# Patient Record
Sex: Male | Born: 1960 | Race: White | Hispanic: No | Marital: Married | Smoking: Current every day smoker
Health system: Southern US, Community
[De-identification: ages and names within clinical notes are randomized; demographics above are authoritative.]

## PROBLEM LIST (undated history)

## (undated) DIAGNOSIS — E7800 Pure hypercholesterolemia, unspecified: Secondary | ICD-10-CM

## (undated) DIAGNOSIS — N200 Calculus of kidney: Secondary | ICD-10-CM

## (undated) DIAGNOSIS — J40 Bronchitis, not specified as acute or chronic: Secondary | ICD-10-CM

## (undated) DIAGNOSIS — I10 Essential (primary) hypertension: Secondary | ICD-10-CM

## (undated) HISTORY — DX: Morbid (severe) obesity due to excess calories: E66.01

## (undated) HISTORY — PX: OTHER SURGICAL HISTORY: SHX169

## (undated) SURGERY — Surgical Case
Anesthesia: *Unknown

---

## 1998-04-01 HISTORY — PX: COLONOSCOPY W/ POLYPECTOMY: SHX1380

## 2009-04-04 ENCOUNTER — Emergency Department (HOSPITAL_COMMUNITY): Admission: EM | Admit: 2009-04-04 | Discharge: 2009-04-04 | Payer: Self-pay | Admitting: Emergency Medicine

## 2009-08-10 ENCOUNTER — Emergency Department (HOSPITAL_COMMUNITY): Admission: EM | Admit: 2009-08-10 | Discharge: 2009-08-10 | Payer: Self-pay | Admitting: Family Medicine

## 2010-06-17 LAB — BASIC METABOLIC PANEL
BUN: 9 mg/dL (ref 6–23)
CO2: 29 mEq/L (ref 19–32)
Calcium: 9.2 mg/dL (ref 8.4–10.5)
Chloride: 106 mEq/L (ref 96–112)
Creatinine, Ser: 0.86 mg/dL (ref 0.4–1.5)
GFR calc Af Amer: >60 mL/min (ref >60)
GFR calc non Af Amer: >60 mL/min (ref >60)
Glucose, Bld: 123 mg/dL — ABNORMAL HIGH (ref 70–99)
Potassium: 4.1 mEq/L (ref 3.5–5.1)
Sodium: 139 mEq/L (ref 135–145)

## 2011-06-28 ENCOUNTER — Encounter (HOSPITAL_COMMUNITY): Payer: Self-pay | Admitting: *Deleted

## 2011-06-28 ENCOUNTER — Emergency Department (HOSPITAL_COMMUNITY)
Admission: EM | Admit: 2011-06-28 | Discharge: 2011-06-28 | Disposition: A | Discharge status: Home / Self Care | Payer: PRIVATE HEALTH INSURANCE | Attending: Emergency Medicine | Admitting: Emergency Medicine

## 2011-06-28 DIAGNOSIS — J209 Acute bronchitis, unspecified: Secondary | ICD-10-CM | POA: Insufficient documentation

## 2011-06-28 DIAGNOSIS — R059 Cough, unspecified: Secondary | ICD-10-CM | POA: Insufficient documentation

## 2011-06-28 DIAGNOSIS — I10 Essential (primary) hypertension: Secondary | ICD-10-CM | POA: Insufficient documentation

## 2011-06-28 DIAGNOSIS — R062 Wheezing: Secondary | ICD-10-CM | POA: Insufficient documentation

## 2011-06-28 DIAGNOSIS — R05 Cough: Secondary | ICD-10-CM | POA: Insufficient documentation

## 2011-06-28 HISTORY — DX: Pure hypercholesterolemia, unspecified: E78.00

## 2011-06-28 HISTORY — DX: Essential (primary) hypertension: I10

## 2011-06-28 HISTORY — DX: Bronchitis, not specified as acute or chronic: J40

## 2011-06-28 LAB — GLUCOSE, CAPILLARY: Glucose-Capillary: 145 mg/dL — ABNORMAL HIGH (ref 70–99)

## 2011-06-28 MED ORDER — BENZONATATE 200 MG PO CAPS: 200.0000 mg | ORAL_CAPSULE | Freq: Three times a day (TID) | ORAL | Status: AC

## 2011-06-28 MED ORDER — PREDNISONE 10 MG PO TABS: ORAL_TABLET | ORAL | Status: DC

## 2011-06-28 MED ORDER — ALBUTEROL SULFATE HFA 108 (90 BASE) MCG/ACT IN AERS
2.0000 | INHALATION_SPRAY | Freq: Once | RESPIRATORY_TRACT | Status: AC
  Administered 2011-06-28: 2 via RESPIRATORY_TRACT
  Filled 2011-06-28: qty 6.7

## 2011-06-28 MED ORDER — AZITHROMYCIN 250 MG PO TABS: 250.0000 mg | ORAL_TABLET | Freq: Every day | ORAL | Status: AC

## 2011-06-28 NOTE — Discharge Instructions (Signed)

## 2011-06-28 NOTE — ED Provider Notes (Signed)
History     CSN: [id number]  Arrival date & time 06/28/11  1031   First MD Initiated Contact with Patient 06/28/11 1045      Chief Complaint  Patient presents with  . Sore Throat  . Cough    (Consider location/radiation/quality/duration/timing/severity/associated sxs/prior treatment) Patient is a 51 y.o. male presenting with cough. The history is provided by the patient.  Cough This is a new problem. The current episode started 2 days ago. The problem occurs constantly. The problem has not changed since onset.The cough is non-productive. There has been no fever. Associated symptoms include sore throat and wheezing. Pertinent negatives include no chest pain, no chills, no ear congestion, no ear pain, no headaches, no rhinorrhea and no shortness of breath. He has tried mist for the symptoms. The treatment provided mild relief. He is a smoker. His past medical history is significant for bronchitis. His past medical history does not include pneumonia, COPD or asthma.    Past Medical History  Diagnosis Date  . Bronchitis   . Hypertension   . Diabetes mellitus   . Hypercholesteremia     Past Surgical History  Procedure Date  . Kidney stone removal     No family history on file.  History  Substance Use Topics  . Smoking status: Current Some Day Smoker    Types: Cigars  . Smokeless tobacco: Not on file  . Alcohol Use: Yes     Occ wine      Review of Systems  Constitutional: Negative for fever and chills.  HENT: Positive for sore throat. Negative for ear pain, congestion, rhinorrhea, trouble swallowing, neck pain, postnasal drip and sinus pressure.   Eyes: Negative.   Respiratory: Positive for cough and wheezing. Negative for chest tightness and shortness of breath.   Cardiovascular: Negative for chest pain.  Gastrointestinal: Negative for nausea and abdominal pain.  Genitourinary: Negative.   Musculoskeletal: Negative for joint swelling and arthralgias.  Skin:  Negative.  Negative for rash and wound.  Neurological: Negative for dizziness, weakness, light-headedness, numbness and headaches.  Hematological: Negative.   Psychiatric/Behavioral: Negative.     Allergies  Penicillins  Home Medications   Current Outpatient Rx  Name Route Sig Dispense Refill  . AZITHROMYCIN 250 MG PO TABS Oral Take 1 tablet (250 mg total) by mouth daily. Take first 2 tablets together, then 1 every day until finished. 6 tablet 0  . BENZONATATE 200 MG PO CAPS Oral Take 1 capsule (200 mg total) by mouth every 8 (eight) hours. 21 capsule 0  . PREDNISONE 10 MG PO TABS  5, 4, 3, 2 then 1 tablet by mouth daily for 5 days total. 15 tablet 0    BP 126/79  Pulse 94  Temp(Src) 98.7 F (37.1 C) (Oral)  Resp 20  Ht 5\' 11"  (1.803 m)  Wt 300 lb (136.079 kg)  BMI 41.84 kg/m2  SpO2 96%  Physical Exam  Nursing note and vitals reviewed. Constitutional: He is oriented to person, place, and time. He appears well-developed and well-nourished.       Obese  HENT:  Head: Normocephalic and atraumatic.  Right Ear: Tympanic membrane normal.  Left Ear: Tympanic membrane normal.  Nose: Nose normal.  Mouth/Throat: Mucous membranes are normal. No oropharyngeal exudate, posterior oropharyngeal edema, posterior oropharyngeal erythema or tonsillar abscesses.  Eyes: Conjunctivae are normal.  Neck: Normal range of motion.  Cardiovascular: Normal rate, regular rhythm, normal heart sounds and intact distal pulses.   Pulmonary/Chest: Effort normal. No stridor. No  respiratory distress. He has wheezes. He has no rales. He exhibits no tenderness.       Occasional bilateral wheeze which clears with cough.  Lungs are otherwise clear.  Abdominal: Soft. Bowel sounds are normal.  Musculoskeletal: Normal range of motion.  Lymphadenopathy:    He has no cervical adenopathy.  Neurological: He is alert and oriented to person, place, and time.  Skin: Skin is warm and dry.  Psychiatric: He has a normal  mood and affect.    ED Course  Procedures (including critical care time)  Labs Reviewed  GLUCOSE, CAPILLARY - Abnormal; Notable for the following:    Glucose-Capillary 145 (*)    All other components within normal limits   No results found.   1. Bronchitis, acute, with bronchospasm       MDM  Exam and symptoms are consistent with bronchitis.  Lungs are clear with no evidence for pneumonia on exam.  Discussed chest x-ray, but patient defers.  Given albuterol MDI with spacer.  Also will prescribe a short course of prednisone, Tessalon for cough suppression and Zithromax .      Candis Musa, PA 06/28/11 1106  Candis Musa, PA 06/28/11 1109

## 2011-06-28 NOTE — ED Notes (Signed)
Pt states sore throat and non productive cough x 2 days. NAD.

## 2011-06-28 NOTE — ED Notes (Signed)
J Idol, PA in with pt 

## 2011-07-02 NOTE — ED Provider Notes (Signed)
Medical screening examination/treatment/procedure(s) were performed by non-physician practitioner and as supervising physician I was immediately available for consultation/collaboration.  Donnetta Hutching, MD 07/02/11 [phone]

## 2012-09-13 ENCOUNTER — Other Ambulatory Visit: Payer: Self-pay | Admitting: Family Medicine

## 2012-09-17 ENCOUNTER — Other Ambulatory Visit (INDEPENDENT_AMBULATORY_CARE_PROVIDER_SITE_OTHER): Payer: BC Managed Care – PPO

## 2012-09-17 DIAGNOSIS — I10 Essential (primary) hypertension: Secondary | ICD-10-CM

## 2012-09-17 DIAGNOSIS — E119 Type 2 diabetes mellitus without complications: Secondary | ICD-10-CM

## 2012-09-17 DIAGNOSIS — Z79899 Other long term (current) drug therapy: Secondary | ICD-10-CM

## 2012-09-17 DIAGNOSIS — E785 Hyperlipidemia, unspecified: Secondary | ICD-10-CM

## 2012-09-17 LAB — LIPID PANEL
Cholesterol: 112 mg/dL (ref 0–200)
HDL: 35 mg/dL — ABNORMAL LOW (ref >39)
LDL Cholesterol: 39 mg/dL (ref 0–99)
Total CHOL/HDL Ratio: 3.2 Ratio
Triglycerides: 190 mg/dL — ABNORMAL HIGH (ref <150)
VLDL: 38 mg/dL (ref 0–40)

## 2012-09-17 LAB — HEMOGLOBIN A1C
Hgb A1c MFr Bld: 6.3 % — ABNORMAL HIGH (ref <5.7)
Mean Plasma Glucose: 134 mg/dL — ABNORMAL HIGH (ref <117)

## 2012-09-17 LAB — CBC WITH DIFFERENTIAL/PLATELET
Basophils Absolute: 0 10*3/uL (ref 0.0–0.1)
Basophils Relative: 0 % (ref 0–1)
Eosinophils Absolute: 0.2 10*3/uL (ref 0.0–0.7)
Eosinophils Relative: 3 % (ref 0–5)
HCT: 45.6 % (ref 39.0–52.0)
Hemoglobin: 15.6 g/dL (ref 13.0–17.0)
Lymphocytes Relative: 25 % (ref 12–46)
Lymphs Abs: 1.5 10*3/uL (ref 0.7–4.0)
MCH: 32.1 pg (ref 26.0–34.0)
MCHC: 34.2 g/dL (ref 30.0–36.0)
MCV: 93.8 fL (ref 78.0–100.0)
Monocytes Absolute: 0.7 10*3/uL (ref 0.1–1.0)
Monocytes Relative: 11 % (ref 3–12)
Neutro Abs: 3.8 10*3/uL (ref 1.7–7.7)
Neutrophils Relative %: 61 % (ref 43–77)
Platelets: 247 10*3/uL (ref 150–400)
RBC: 4.86 MIL/uL (ref 4.22–5.81)
RDW: 14.4 % (ref 11.5–15.5)
WBC: 6.1 10*3/uL (ref 4.0–10.5)

## 2012-09-17 LAB — COMPREHENSIVE METABOLIC PANEL
ALT: 34 U/L (ref 0–53)
AST: 29 U/L (ref 0–37)
Albumin: 4.1 g/dL (ref 3.5–5.2)
Alkaline Phosphatase: 52 U/L (ref 39–117)
BUN: 15 mg/dL (ref 6–23)
CO2: 29 mEq/L (ref 19–32)
Calcium: 9.5 mg/dL (ref 8.4–10.5)
Chloride: 104 mEq/L (ref 96–112)
Creat: 1.09 mg/dL (ref 0.50–1.35)
Glucose, Bld: 138 mg/dL — ABNORMAL HIGH (ref 70–99)
Potassium: 4.3 mEq/L (ref 3.5–5.3)
Sodium: 143 mEq/L (ref 135–145)
Total Bilirubin: 0.5 mg/dL (ref 0.3–1.2)
Total Protein: 7.1 g/dL (ref 6.0–8.3)

## 2012-09-25 ENCOUNTER — Ambulatory Visit: Payer: Self-pay | Admitting: Family Medicine

## 2012-10-08 ENCOUNTER — Encounter: Payer: Self-pay | Admitting: Family Medicine

## 2012-10-09 MED ORDER — AMLODIPINE BESYLATE 10 MG PO TABS: 10.0000 mg | ORAL_TABLET | Freq: Every day | ORAL | Status: DC

## 2012-10-09 MED ORDER — SIMVASTATIN 40 MG PO TABS: 40.0000 mg | ORAL_TABLET | Freq: Every day | ORAL | Status: DC

## 2012-10-15 ENCOUNTER — Encounter: Payer: Self-pay | Admitting: Family Medicine

## 2012-10-16 ENCOUNTER — Other Ambulatory Visit: Payer: Self-pay | Admitting: Family Medicine

## 2012-10-16 MED ORDER — HYDROCHLOROTHIAZIDE 25 MG PO TABS: 25.0000 mg | ORAL_TABLET | Freq: Every day | ORAL | Status: DC

## 2012-10-16 NOTE — Telephone Encounter (Signed)
Rx Refilled  

## 2012-10-22 ENCOUNTER — Ambulatory Visit: Payer: BC Managed Care – PPO | Admitting: Family Medicine

## 2012-10-30 ENCOUNTER — Encounter: Payer: Self-pay | Admitting: Family Medicine

## 2012-10-30 ENCOUNTER — Ambulatory Visit (INDEPENDENT_AMBULATORY_CARE_PROVIDER_SITE_OTHER): Payer: BC Managed Care – PPO | Admitting: Family Medicine

## 2012-10-30 VITALS — BP 110/78 | HR 86 | Temp 98.8°F | Resp 20 | Wt 350.0 lb

## 2012-10-30 DIAGNOSIS — E7800 Pure hypercholesterolemia, unspecified: Secondary | ICD-10-CM

## 2012-10-30 DIAGNOSIS — E119 Type 2 diabetes mellitus without complications: Secondary | ICD-10-CM

## 2012-10-30 DIAGNOSIS — I10 Essential (primary) hypertension: Secondary | ICD-10-CM | POA: Insufficient documentation

## 2012-10-30 HISTORY — DX: Morbid (severe) obesity due to excess calories: E66.01

## 2012-10-30 NOTE — Progress Notes (Signed)
Subjective:    Patient ID: Eric Johnson, male    DOB: 06/22/1960, 52 y.o.   MRN: [id number]  HPI Patient is here today for followup of his diabetes mellitus type 2. Hemoglobin A1c is 6.3 indicating adequate control. He denies any polyuria, polydipsia, blurred vision. He denies any peripheral neuropathy. He also has hypertension. He is currently taking amlodipine 10 mg by mouth daily, hydrochlorothiazide 25 mg by mouth daily. He denies any chest pain or shortness of breath or dyspnea on exertion. He also has hyperlipidemia. He is currently taking Zocor 40 mg by mouth daily. He denies any myalgia or right upper quadrant pain. His most recent labwork as listed below: No visits with results within 1 Month(s) from this visit. Latest known visit with results is:  Lab on 09/17/2012  Component Date Value Range Status  . WBC 09/17/2012 6.1  4.0 - 10.5 K/uL Final  . RBC 09/17/2012 4.86  4.22 - 5.81 MIL/uL Final  . Hemoglobin 09/17/2012 15.6  13.0 - 17.0 g/dL Final  . HCT 40/98/1191 45.6  39.0 - 52.0 % Final  . MCV 09/17/2012 93.8  78.0 - 100.0 fL Final  . MCH 09/17/2012 32.1  26.0 - 34.0 pg Final  . MCHC 09/17/2012 34.2  30.0 - 36.0 g/dL Final  . RDW 47/82/9562 14.4  11.5 - 15.5 % Final  . Platelets 09/17/2012 247  150 - 400 K/uL Final  . Neutrophils Relative % 09/17/2012 61  43 - 77 % Final  . Neutro Abs 09/17/2012 3.8  1.7 - 7.7 K/uL Final  . Lymphocytes Relative 09/17/2012 25  12 - 46 % Final  . Lymphs Abs 09/17/2012 1.5  0.7 - 4.0 K/uL Final  . Monocytes Relative 09/17/2012 11  3 - 12 % Final  . Monocytes Absolute 09/17/2012 0.7  0.1 - 1.0 K/uL Final  . Eosinophils Relative 09/17/2012 3  0 - 5 % Final  . Eosinophils Absolute 09/17/2012 0.2  0.0 - 0.7 K/uL Final  . Basophils Relative 09/17/2012 0  0 - 1 % Final  . Basophils Absolute 09/17/2012 0.0  0.0 - 0.1 K/uL Final  . Smear Review 09/17/2012 Criteria for review not met   Final  . Sodium 09/17/2012 143  135 - 145 mEq/L Final  .  Potassium 09/17/2012 4.3  3.5 - 5.3 mEq/L Final  . Chloride 09/17/2012 104  96 - 112 mEq/L Final  . CO2 09/17/2012 29  19 - 32 mEq/L Final  . Glucose, Bld 09/17/2012 138* 70 - 99 mg/dL Final  . BUN 13/10/6576 15  6 - 23 mg/dL Final  . Creat 46/96/2952 1.09  0.50 - 1.35 mg/dL Final  . Total Bilirubin 09/17/2012 0.5  0.3 - 1.2 mg/dL Final  . Alkaline Phosphatase 09/17/2012 52  39 - 117 U/L Final  . AST 09/17/2012 29  0 - 37 U/L Final  . ALT 09/17/2012 34  0 - 53 U/L Final  . Total Protein 09/17/2012 7.1  6.0 - 8.3 g/dL Final  . Albumin 84/13/2440 4.1  3.5 - 5.2 g/dL Final  . Calcium 01/26/2535 9.5  8.4 - 10.5 mg/dL Final  . Hemoglobin U4Q 09/17/2012 6.3* <5.7 % Final   Comment:  According to the ADA Clinical Practice Recommendations for 2011, when                          HbA1c is used as a screening test:                                                       >=6.5%   Diagnostic of Diabetes Mellitus                                     (if abnormal result is confirmed)                                                     5.7-6.4%   Increased risk of developing Diabetes Mellitus                                                     References:Diagnosis and Classification of Diabetes Mellitus,Diabetes                          Care,2011,34(Suppl 1):S62-S69 and Standards of Medical Care in                                  Diabetes - 2011,Diabetes Care,2011,34 (Suppl 1):S11-S61.                             . Mean Plasma Glucose 09/17/2012 134* <117 mg/dL Final  . Cholesterol 21/30/8657 112  0 - 200 mg/dL Final   Comment: ATP III Classification:                                < 200        mg/dL        Desirable                               200 - 239     mg/dL        Borderline High                               >= 240        mg/dL        High                             . Triglycerides 09/17/2012  190* <150 mg/dL Final  . HDL 84/69/6295 35* >39 mg/dL Final  . Total CHOL/HDL Ratio 09/17/2012 3.2   Final  . VLDL 09/17/2012 38  0 - 40 mg/dL Final  .  LDL Cholesterol 09/17/2012 39  0 - 99 mg/dL Final   Comment:                            Total Cholesterol/HDL Ratio:CHD Risk                                                 Coronary Heart Disease Risk Table                                                                 Men       Women                                   1/2 Average Risk              3.4        3.3                                       Average Risk              5.0        4.4                                    2X Average Risk              9.6        7.1                                    3X Average Risk             23.4       11.0                          Use the calculated Patient Ratio above and the CHD Risk table                           to determine the patient's CHD Risk.                          ATP III Classification (LDL):                                < 100        mg/dL         Optimal                               100 - 129     mg/dL  Near or Above Optimal                               130 - 159     mg/dL         Borderline High                               160 - 189     mg/dL         High                                > 190        mg/dL         Very High                              Past Medical History  Diagnosis Date  . Bronchitis   . Diabetes mellitus   . Hypercholesteremia   . Hypertension   . Morbid obesity 10/30/2012   Current Outpatient Prescriptions on File Prior to Visit  Medication Sig Dispense Refill  . amLODipine (NORVASC) 10 MG tablet Take 1 tablet (10 mg total) by mouth daily.  90 tablet  0  . hydrochlorothiazide (HYDRODIURIL) 25 MG tablet Take 1 tablet (25 mg total) by mouth daily.  90 tablet  0  . metFORMIN (GLUCOPHAGE) 500 MG tablet TAKE ONE TABLET BY MOUTH TWICE DAILY  180 tablet  0  . simvastatin (ZOCOR) 40 MG tablet Take 1  tablet (40 mg total) by mouth at bedtime.  90 tablet  0   No current facility-administered medications on file prior to visit.   Allergies  Allergen Reactions  . Penicillins    History   Social History  . Marital Status: Married    Spouse Name: N/A    Number of Children: N/A  . Years of Education: N/A   Occupational History  . Not on file.   Social History Main Topics  . Smoking status: Current Some Day Smoker    Types: Cigars  . Smokeless tobacco: Not on file  . Alcohol Use: Yes     Comment: Occ wine  . Drug Use: No  . Sexually Active:    Other Topics Concern  . Not on file   Social History Narrative  . No narrative on file      Review of Systems  All other systems reviewed and are negative.       Objective:   Physical Exam  Constitutional: He is oriented to person, place, and time. He appears well-developed and well-nourished.  Neck: Normal range of motion. Neck supple. No thyromegaly present.  Cardiovascular: Normal rate, regular rhythm, normal heart sounds and intact distal pulses.  Exam reveals no gallop and no friction rub.   No murmur heard. Pulmonary/Chest: Effort normal and breath sounds normal. No respiratory distress. He has no wheezes. He has no rales. He exhibits no tenderness.  Abdominal: Soft. Bowel sounds are normal. He exhibits no distension and no mass. There is no tenderness. There is no rebound and no guarding.  Lymphadenopathy:    He has no cervical adenopathy.  Neurological: He is alert and oriented to person, place, and time. He has normal reflexes.  Skin: Skin is warm. No erythema.  Assessment & Plan:  1. Hypercholesteremia Cholesterols controlled except for low HDL and slightly elevated triglycerides. I recommended a low carbohydrate diet and increasing aerobic exercise to address his dyslipidemia.  2. Hypertension Blood pressures well controlled. Continue current medications at present dosages.  3. Type II or  unspecified type diabetes mellitus without mention of complication, not stated as uncontrolled Diabetes well controlled. Recommended annual eye exam, low carbohydrate diet, smoking cessation, and increased aerobic exercise  4. Morbid obesity See problem #3.

## 2012-12-17 ENCOUNTER — Other Ambulatory Visit: Payer: Self-pay | Admitting: Family Medicine

## 2012-12-17 MED ORDER — METFORMIN HCL 500 MG PO TABS: ORAL_TABLET | ORAL | Status: DC

## 2012-12-17 NOTE — Telephone Encounter (Signed)
Rx Refilled  

## 2012-12-24 ENCOUNTER — Encounter: Payer: Self-pay | Admitting: Family Medicine

## 2012-12-25 ENCOUNTER — Ambulatory Visit (INDEPENDENT_AMBULATORY_CARE_PROVIDER_SITE_OTHER): Payer: BC Managed Care – PPO | Admitting: Family Medicine

## 2012-12-25 DIAGNOSIS — Z23 Encounter for immunization: Secondary | ICD-10-CM

## 2013-01-06 ENCOUNTER — Other Ambulatory Visit: Payer: Self-pay | Admitting: Family Medicine

## 2013-01-07 MED ORDER — AMLODIPINE BESYLATE 10 MG PO TABS: 10.0000 mg | ORAL_TABLET | Freq: Every day | ORAL | Status: DC

## 2013-01-07 MED ORDER — SIMVASTATIN 40 MG PO TABS: 40.0000 mg | ORAL_TABLET | Freq: Every day | ORAL | Status: DC

## 2013-01-18 ENCOUNTER — Other Ambulatory Visit: Payer: Self-pay | Admitting: Family Medicine

## 2013-01-20 MED ORDER — HYDROCHLOROTHIAZIDE 25 MG PO TABS: 25.0000 mg | ORAL_TABLET | Freq: Every day | ORAL | Status: DC

## 2013-01-20 NOTE — Telephone Encounter (Signed)
Medication refilled per protocol. 

## 2013-02-12 ENCOUNTER — Other Ambulatory Visit: Payer: Self-pay | Admitting: Family Medicine

## 2013-02-12 MED ORDER — ZOSTER VACCINE LIVE 19400 UNT/0.65ML ~~LOC~~ SOLR: 0.6500 mL | Freq: Once | SUBCUTANEOUS | Status: DC

## 2013-02-12 NOTE — Telephone Encounter (Signed)
Zostavax sent to pharm 

## 2013-03-19 ENCOUNTER — Encounter: Payer: Self-pay | Admitting: Family Medicine

## 2013-03-19 MED ORDER — METFORMIN HCL 500 MG PO TABS: ORAL_TABLET | ORAL | Status: DC

## 2013-03-22 ENCOUNTER — Encounter: Payer: Self-pay | Admitting: Family Medicine

## 2013-03-23 ENCOUNTER — Other Ambulatory Visit: Payer: Self-pay | Admitting: Family Medicine

## 2013-03-23 MED ORDER — METFORMIN HCL 500 MG PO TABS: ORAL_TABLET | ORAL | Status: DC

## 2013-03-23 NOTE — Telephone Encounter (Signed)
Rx Refilled  

## 2013-04-07 ENCOUNTER — Encounter: Payer: Self-pay | Admitting: Family Medicine

## 2013-04-08 ENCOUNTER — Other Ambulatory Visit: Payer: Self-pay | Admitting: *Deleted

## 2013-04-08 MED ORDER — SIMVASTATIN 40 MG PO TABS: 40.0000 mg | ORAL_TABLET | Freq: Every day | ORAL | Status: DC

## 2013-04-08 MED ORDER — AMLODIPINE BESYLATE 10 MG PO TABS: 10.0000 mg | ORAL_TABLET | Freq: Every day | ORAL | Status: DC

## 2013-04-08 NOTE — Telephone Encounter (Signed)
Med refilled.

## 2013-07-15 ENCOUNTER — Encounter: Payer: Self-pay | Admitting: Family Medicine

## 2013-07-16 MED ORDER — AMLODIPINE BESYLATE 10 MG PO TABS: 10.0000 mg | ORAL_TABLET | Freq: Every day | ORAL | Status: DC

## 2013-07-16 MED ORDER — SIMVASTATIN 40 MG PO TABS: 40.0000 mg | ORAL_TABLET | Freq: Every day | ORAL | Status: DC

## 2013-07-16 MED ORDER — HYDROCHLOROTHIAZIDE 25 MG PO TABS: 25.0000 mg | ORAL_TABLET | Freq: Every day | ORAL | Status: DC

## 2013-07-28 ENCOUNTER — Other Ambulatory Visit: Payer: BC Managed Care – PPO

## 2013-07-28 DIAGNOSIS — Z79899 Other long term (current) drug therapy: Secondary | ICD-10-CM

## 2013-07-28 DIAGNOSIS — E785 Hyperlipidemia, unspecified: Secondary | ICD-10-CM

## 2013-07-28 DIAGNOSIS — E119 Type 2 diabetes mellitus without complications: Secondary | ICD-10-CM

## 2013-07-28 LAB — COMPLETE METABOLIC PANEL WITH GFR
ALT: 35 U/L (ref 0–53)
AST: 24 U/L (ref 0–37)
Albumin: 4.2 g/dL (ref 3.5–5.2)
Alkaline Phosphatase: 47 U/L (ref 39–117)
BUN: 22 mg/dL (ref 6–23)
CO2: 27 mEq/L (ref 19–32)
Calcium: 9.1 mg/dL (ref 8.4–10.5)
Chloride: 102 mEq/L (ref 96–112)
Creat: 1.09 mg/dL (ref 0.50–1.35)
GFR, Est African American: 88 mL/min
GFR, Est Non African American: 77 mL/min
Glucose, Bld: 131 mg/dL — ABNORMAL HIGH (ref 70–99)
Potassium: 4 mEq/L (ref 3.5–5.3)
Sodium: 137 mEq/L (ref 135–145)
Total Bilirubin: 0.5 mg/dL (ref 0.2–1.2)
Total Protein: 6.8 g/dL (ref 6.0–8.3)

## 2013-07-28 LAB — CBC WITH DIFFERENTIAL/PLATELET
Basophils Absolute: 0 K/uL (ref 0.0–0.1)
Basophils Relative: 0 % (ref 0–1)
Eosinophils Absolute: 0.1 K/uL (ref 0.0–0.7)
Eosinophils Relative: 2 % (ref 0–5)
HCT: 45.4 % (ref 39.0–52.0)
Hemoglobin: 15.6 g/dL (ref 13.0–17.0)
Lymphocytes Relative: 28 % (ref 12–46)
Lymphs Abs: 1.8 K/uL (ref 0.7–4.0)
MCH: 31.8 pg (ref 26.0–34.0)
MCHC: 34.4 g/dL (ref 30.0–36.0)
MCV: 92.7 fL (ref 78.0–100.0)
Monocytes Absolute: 0.8 K/uL (ref 0.1–1.0)
Monocytes Relative: 13 % — ABNORMAL HIGH (ref 3–12)
Neutro Abs: 3.6 K/uL (ref 1.7–7.7)
Neutrophils Relative %: 57 % (ref 43–77)
Platelets: 223 K/uL (ref 150–400)
RBC: 4.9 MIL/uL (ref 4.22–5.81)
RDW: 14 % (ref 11.5–15.5)
WBC: 6.4 K/uL (ref 4.0–10.5)

## 2013-07-28 LAB — LIPID PANEL
Cholesterol: 100 mg/dL (ref 0–200)
HDL: 33 mg/dL — ABNORMAL LOW (ref >39)
LDL Cholesterol: 29 mg/dL (ref 0–99)
Total CHOL/HDL Ratio: 3 ratio
Triglycerides: 192 mg/dL — ABNORMAL HIGH (ref <150)
VLDL: 38 mg/dL (ref 0–40)

## 2013-07-28 LAB — HEMOGLOBIN A1C
Hgb A1c MFr Bld: 6.6 % — ABNORMAL HIGH (ref <5.7)
Mean Plasma Glucose: 143 mg/dL — ABNORMAL HIGH (ref <117)

## 2013-07-30 ENCOUNTER — Ambulatory Visit (INDEPENDENT_AMBULATORY_CARE_PROVIDER_SITE_OTHER): Payer: BC Managed Care – PPO | Admitting: Family Medicine

## 2013-07-30 ENCOUNTER — Encounter: Payer: Self-pay | Admitting: Family Medicine

## 2013-07-30 VITALS — BP 128/72 | HR 72 | Temp 98.7°F | Resp 20 | Ht 71.0 in | Wt 333.0 lb

## 2013-07-30 DIAGNOSIS — Z1211 Encounter for screening for malignant neoplasm of colon: Secondary | ICD-10-CM

## 2013-07-30 DIAGNOSIS — E119 Type 2 diabetes mellitus without complications: Secondary | ICD-10-CM

## 2013-07-30 DIAGNOSIS — E785 Hyperlipidemia, unspecified: Secondary | ICD-10-CM

## 2013-07-30 DIAGNOSIS — I10 Essential (primary) hypertension: Secondary | ICD-10-CM

## 2013-07-30 LAB — MICROALBUMIN, URINE: Microalb, Ur: 0.85 mg/dL (ref 0.00–1.89)

## 2013-07-30 MED ORDER — LOSARTAN POTASSIUM 100 MG PO TABS: 100.0000 mg | ORAL_TABLET | Freq: Every day | ORAL | Status: DC

## 2013-07-30 NOTE — Progress Notes (Signed)
Subjective:    Patient ID: Eric Johnson, male    DOB: 01/15/1961, 53 y.o.   MRN: [id number]  HPI  Patient is here today for followup of his diabetes mellitus type 2. Hemoglobin A1c is 6.6 indicating fair glycemic control. He denies any polyuria, polydipsia, blurred vision. He denies any peripheral neuropathy. He also has hypertension. He is currently taking amlodipine 10 mg by mouth daily, hydrochlorothiazide 25 mg by mouth daily. He denies any chest pain or shortness of breath or dyspnea on exertion. He also has hyperlipidemia. He is currently taking Zocor 40 mg by mouth daily. He denies any myalgia or right upper quadrant pain. His most recent labwork as listed below: Lab on 07/28/2013  Component Date Value Ref Range Status  . Cholesterol 07/28/2013 100  0 - 200 mg/dL Final   Comment: ATP III Classification:                                < 200        mg/dL        Desirable                               200 - 239     mg/dL        Borderline High                               >= 240        mg/dL        High                             . Triglycerides 07/28/2013 192* <150 mg/dL Final  . HDL 07/28/2013 33* >39 mg/dL Final  . Total CHOL/HDL Ratio 07/28/2013 3.0   Final  . VLDL 07/28/2013 38  0 - 40 mg/dL Final  . LDL Cholesterol 07/28/2013 29  0 - 99 mg/dL Final   Comment:                            Total Cholesterol/HDL Ratio:CHD Risk                                                 Coronary Heart Disease Risk Table                                                                 Men       Women                                   1/2 Average Risk              3.4        3.3  Average Risk              5.0        4.4                                    2X Average Risk              9.6        7.1                                    3X Average Risk             23.4       11.0                          Use the calculated Patient Ratio above and the CHD Risk  table                           to determine the patient's CHD Risk.                          ATP III Classification (LDL):                                < 100        mg/dL         Optimal                               100 - 129     mg/dL         Near or Above Optimal                               130 - 159     mg/dL         Borderline High                               160 - 189     mg/dL         High                                > 190        mg/dL         Very High                             . Hemoglobin A1C 07/28/2013 6.6* <5.7 % Final   Comment:  According to the ADA Clinical Practice Recommendations for 2011, when                          HbA1c is used as a screening test:                                                       >=6.5%   Diagnostic of Diabetes Mellitus                                     (if abnormal result is confirmed)                                                     5.7-6.4%   Increased risk of developing Diabetes Mellitus                                                     References:Diagnosis and Classification of Diabetes Mellitus,Diabetes                          GHWE,9937,16(RCVEL 1):S62-S69 and Standards of Medical Care in                                  Diabetes - 2011,Diabetes FYBO,1751,02 (Suppl 1):S11-S61.                             . Mean Plasma Glucose 07/28/2013 143* <117 mg/dL Final  . WBC 07/28/2013 6.4  4.0 - 10.5 K/uL Final  . RBC 07/28/2013 4.90  4.22 - 5.81 MIL/uL Final  . Hemoglobin 07/28/2013 15.6  13.0 - 17.0 g/dL Final  . HCT 07/28/2013 45.4  39.0 - 52.0 % Final  . MCV 07/28/2013 92.7  78.0 - 100.0 fL Final  . MCH 07/28/2013 31.8  26.0 - 34.0 pg Final  . MCHC 07/28/2013 34.4  30.0 - 36.0 g/dL Final  . RDW 07/28/2013 14.0  11.5 - 15.5 % Final  . Platelets 07/28/2013 223  150 - 400 K/uL Final  . Neutrophils Relative % 07/28/2013 57  43 -  77 % Final  . Neutro Abs 07/28/2013 3.6  1.7 - 7.7 K/uL Final  . Lymphocytes Relative 07/28/2013 28  12 - 46 % Final  . Lymphs Abs 07/28/2013 1.8  0.7 - 4.0 K/uL Final  . Monocytes Relative 07/28/2013 13* 3 - 12 % Final  . Monocytes Absolute 07/28/2013 0.8  0.1 - 1.0 K/uL Final  . Eosinophils Relative 07/28/2013 2  0 - 5 % Final  . Eosinophils Absolute 07/28/2013 0.1  0.0 - 0.7 K/uL Final  . Basophils Relative 07/28/2013 0  0 - 1 % Final  . Basophils Absolute 07/28/2013 0.0  0.0 - 0.1 K/uL Final  . Smear Review 07/28/2013 Criteria for review  not met   Final  . Sodium 07/28/2013 137  135 - 145 mEq/L Final  . Potassium 07/28/2013 4.0  3.5 - 5.3 mEq/L Final  . Chloride 07/28/2013 102  96 - 112 mEq/L Final  . CO2 07/28/2013 27  19 - 32 mEq/L Final  . Glucose, Bld 07/28/2013 131* 70 - 99 mg/dL Final  . BUN 07/28/2013 22  6 - 23 mg/dL Final  . Creat 07/28/2013 1.09  0.50 - 1.35 mg/dL Final  . Total Bilirubin 07/28/2013 0.5  0.2 - 1.2 mg/dL Final  . Alkaline Phosphatase 07/28/2013 47  39 - 117 U/L Final  . AST 07/28/2013 24  0 - 37 U/L Final  . ALT 07/28/2013 35  0 - 53 U/L Final  . Total Protein 07/28/2013 6.8  6.0 - 8.3 g/dL Final  . Albumin 07/28/2013 4.2  3.5 - 5.2 g/dL Final  . Calcium 07/28/2013 9.1  8.4 - 10.5 mg/dL Final  . GFR, Est African American 07/28/2013 88   Final  . GFR, Est Non African American 07/28/2013 77   Final   Comment:                            The estimated GFR is a calculation valid for adults (>=1 years old)                          that uses the CKD-EPI algorithm to adjust for age and sex. It is                            not to be used for children, pregnant women, hospitalized patients,                             patients on dialysis, or with rapidly changing kidney function.                          According to the NKDEP, eGFR >89 is normal, 60-89 shows mild                          impairment, 30-59 shows moderate impairment, 15-29 shows severe                           impairment and <15 is ESRD.                              Past Medical History  Diagnosis Date  . Bronchitis   . Diabetes mellitus   . Hypercholesteremia   . Hypertension   . Morbid obesity 10/30/2012   Current Outpatient Prescriptions on File Prior to Visit  Medication Sig Dispense Refill  . amLODipine (NORVASC) 10 MG tablet Take 1 tablet (10 mg total) by mouth daily.  90 tablet  0  . Cyanocobalamin (VITAMIN B 12 PO) Take by mouth.      . hydrochlorothiazide (HYDRODIURIL) 25 MG tablet Take 1 tablet (25 mg total) by mouth daily.  90 tablet  0  . metFORMIN (GLUCOPHAGE) 500 MG tablet TAKE ONE TABLET BY MOUTH TWICE DAILY  180 tablet  1  . saw palmetto 160 MG capsule Take 160 mg by mouth 2 (two)  times daily.      . simvastatin (ZOCOR) 40 MG tablet Take 1 tablet (40 mg total) by mouth at bedtime.  90 tablet  0   No current facility-administered medications on file prior to visit.   Allergies  Allergen Reactions  . Penicillins    History   Social History  . Marital Status: Married    Spouse Name: N/A    Number of Children: N/A  . Years of Education: N/A   Occupational History  . Not on file.   Social History Main Topics  . Smoking status: Current Some Day Smoker    Types: Cigars  . Smokeless tobacco: Not on file  . Alcohol Use: Yes     Comment: Occ wine  . Drug Use: No  . Sexual Activity:    Other Topics Concern  . Not on file   Social History Narrative  . No narrative on file   No family history on file.  Review of Systems  All other systems reviewed and are negative.      Objective:   Physical Exam  Vitals reviewed. Constitutional: He appears well-developed and well-nourished.  HENT:  Right Ear: External ear normal.  Left Ear: External ear normal.  Nose: Nose normal.  Mouth/Throat: Oropharynx is clear and moist. No oropharyngeal exudate.  Neck: Neck supple. No JVD present. No thyromegaly present.  Cardiovascular: Normal rate, regular  rhythm, normal heart sounds and intact distal pulses.   No murmur heard. Pulmonary/Chest: Effort normal and breath sounds normal. No respiratory distress. He has no wheezes. He has no rales. He exhibits no tenderness.  Abdominal: Soft. Bowel sounds are normal. He exhibits no distension. There is no tenderness. There is no rebound and no guarding.  Musculoskeletal: He exhibits no edema.  Lymphadenopathy:    He has no cervical adenopathy.          Assessment & Plan:  1. Type II or unspecified type diabetes mellitus without mention of complication, not stated as uncontrolled Given the fact his A1c has risen to 6.6 I would recommend increasing metformin 500 mg by mouth twice a day. Also recommended diet, exercise, lifestyle changes to try to achieve weight loss. Also recommended discontinuing hydrochlorothiazide and starting the patient on losartan 100 mg by mouth daily for renal protection I will check a urine microalbumin. I also recommended a diabetic eye exam.  I also recommended an aspirin 81 mg by mouth daily. - Microalbumin, urine  2. Hypertension Blood pressure is acceptable. I will change chlorothiazide to losartan 100 mg poqday  for renal protection.  3. Other and unspecified hyperlipidemia Cholesterol was within acceptable limits.

## 2013-07-30 NOTE — Addendum Note (Signed)
Addended by: Jenna Luo on: 07/30/2013 10:28 AM   Modules accepted: Orders

## 2013-08-05 ENCOUNTER — Other Ambulatory Visit: Payer: Self-pay

## 2013-08-05 ENCOUNTER — Telehealth: Payer: Self-pay

## 2013-08-05 DIAGNOSIS — Z1211 Encounter for screening for malignant neoplasm of colon: Secondary | ICD-10-CM

## 2013-08-09 ENCOUNTER — Encounter (HOSPITAL_COMMUNITY): Payer: Self-pay | Admitting: Pharmacy Technician

## 2013-08-10 NOTE — Telephone Encounter (Signed)
Appropriate. No metformin day of procedure.  

## 2013-08-10 NOTE — Telephone Encounter (Signed)
Gastroenterology Pre-Procedure Review  Request Date:08/05/2013 Requesting Physician: Dr. Dennard Schaumann  PATIENT REVIEW QUESTIONS: The patient responded to the following health history questions as indicated:    1. Diabetes Melitis: YES 2. Joint replacements in the past 12 months: no 3. Major health problems in the past 3 months: no 4. Has an artificial valve or MVP: no 5. Has a defibrillator: no 6. Has been advised in past to take antibiotics in advance of a procedure like teeth cleaning: no    MEDICATIONS & ALLERGIES:    Patient reports the following regarding taking any blood thinners:   Plavix? no Aspirin? YES Coumadin? no  Patient confirms/reports the following medications:  Current Outpatient Prescriptions  Medication Sig Dispense Refill  . amLODipine (NORVASC) 10 MG tablet Take 1 tablet (10 mg total) by mouth daily.  90 tablet  0  . beta carotene w/minerals (OCUVITE) tablet Take 1 tablet by mouth daily.      . Cyanocobalamin (VITAMIN B 12 PO) Take by mouth.      . hydrochlorothiazide (HYDRODIURIL) 25 MG tablet Take 1 tablet (25 mg total) by mouth daily.  90 tablet  0  . metFORMIN (GLUCOPHAGE) 500 MG tablet TAKE ONE TABLET BY MOUTH TWICE DAILY  180 tablet  1  . saw palmetto 160 MG capsule Take 160 mg by mouth 2 (two) times daily.      . simvastatin (ZOCOR) 40 MG tablet Take 1 tablet (40 mg total) by mouth at bedtime.  90 tablet  0  . losartan (COZAAR) 100 MG tablet Take 1 tablet (100 mg total) by mouth daily.  90 tablet  3   No current facility-administered medications for this visit.    Patient confirms/reports the following allergies:  Allergies  Allergen Reactions  . Penicillins     No orders of the defined types were placed in this encounter.    AUTHORIZATION INFORMATION Primary Insurance:   ID #:   Group #:  Pre-Cert / Auth required: Pre-Cert / Auth #:   Secondary Insurance:   ID #:   Group #:  Pre-Cert / Auth required: Pre-Cert / Auth #:   SCHEDULE  INFORMATION: Procedure has been scheduled as follows:  Date:08/27/2013                 Time: 11:30 AM  Location: [facility] Short Stay  This Gastroenterology Pre-Precedure Review Form is being routed to the following provider(s): R. Garfield Cornea, MD

## 2013-08-11 MED ORDER — PEG-KCL-NACL-NASULF-NA ASC-C 100 G PO SOLR: 1.0000 | ORAL | Status: DC

## 2013-08-11 NOTE — Telephone Encounter (Signed)
Wrote on the instructions, no metformin on the day of procedure. Rx sent to the pharmacy and instructions mailed to pt.

## 2013-08-27 ENCOUNTER — Ambulatory Visit (HOSPITAL_COMMUNITY)
Admission: RE | Admit: 2013-08-27 | Discharge: 2013-08-27 | Disposition: A | Discharge status: Home / Self Care | Payer: BC Managed Care – PPO | Source: Ambulatory Visit | Attending: Internal Medicine | Admitting: Internal Medicine

## 2013-08-27 ENCOUNTER — Encounter (HOSPITAL_COMMUNITY)
Admission: RE | Disposition: A | Discharge status: Home / Self Care | Payer: Self-pay | Source: Ambulatory Visit | Attending: Internal Medicine

## 2013-08-27 ENCOUNTER — Encounter (HOSPITAL_COMMUNITY): Payer: Self-pay | Admitting: *Deleted

## 2013-08-27 DIAGNOSIS — D126 Benign neoplasm of colon, unspecified: Secondary | ICD-10-CM | POA: Insufficient documentation

## 2013-08-27 DIAGNOSIS — Z1211 Encounter for screening for malignant neoplasm of colon: Secondary | ICD-10-CM | POA: Insufficient documentation

## 2013-08-27 DIAGNOSIS — E7800 Pure hypercholesterolemia, unspecified: Secondary | ICD-10-CM | POA: Insufficient documentation

## 2013-08-27 DIAGNOSIS — E119 Type 2 diabetes mellitus without complications: Secondary | ICD-10-CM | POA: Insufficient documentation

## 2013-08-27 DIAGNOSIS — Z86010 Personal history of colonic polyps: Secondary | ICD-10-CM

## 2013-08-27 DIAGNOSIS — Z79899 Other long term (current) drug therapy: Secondary | ICD-10-CM | POA: Insufficient documentation

## 2013-08-27 DIAGNOSIS — I10 Essential (primary) hypertension: Secondary | ICD-10-CM | POA: Insufficient documentation

## 2013-08-27 DIAGNOSIS — Z6841 Body Mass Index (BMI) 40.0 and over, adult: Secondary | ICD-10-CM | POA: Insufficient documentation

## 2013-08-27 DIAGNOSIS — Q438 Other specified congenital malformations of intestine: Secondary | ICD-10-CM | POA: Insufficient documentation

## 2013-08-27 DIAGNOSIS — K5730 Diverticulosis of large intestine without perforation or abscess without bleeding: Secondary | ICD-10-CM

## 2013-08-27 HISTORY — PX: COLONOSCOPY: SHX5424

## 2013-08-27 LAB — GLUCOSE, CAPILLARY: Glucose-Capillary: 137 mg/dL — ABNORMAL HIGH (ref 70–99)

## 2013-08-27 SURGERY — COLONOSCOPY
Anesthesia: Moderate Sedation

## 2013-08-27 MED ORDER — MEPERIDINE HCL 100 MG/ML IJ SOLN
INTRAMUSCULAR | Status: AC
  Filled 2013-08-27: qty 2

## 2013-08-27 MED ORDER — MIDAZOLAM HCL 5 MG/5ML IJ SOLN
INTRAMUSCULAR | Status: DC | PRN
  Administered 2013-08-27: 1 mg via INTRAVENOUS
  Administered 2013-08-27 (×2): 2 mg via INTRAVENOUS
  Administered 2013-08-27: 1 mg via INTRAVENOUS

## 2013-08-27 MED ORDER — ONDANSETRON HCL 4 MG/2ML IJ SOLN
INTRAMUSCULAR | Status: DC | PRN
  Administered 2013-08-27: 4 mg via INTRAVENOUS

## 2013-08-27 MED ORDER — SODIUM CHLORIDE 0.9 % IV SOLN
INTRAVENOUS | Status: DC
  Administered 2013-08-27: 1000 mL via INTRAVENOUS

## 2013-08-27 MED ORDER — MIDAZOLAM HCL 5 MG/5ML IJ SOLN
INTRAMUSCULAR | Status: AC
  Filled 2013-08-27: qty 10

## 2013-08-27 MED ORDER — STERILE WATER FOR IRRIGATION IR SOLN
Status: DC | PRN
  Administered 2013-08-27: 11:00:00

## 2013-08-27 MED ORDER — ONDANSETRON HCL 4 MG/2ML IJ SOLN
INTRAMUSCULAR | Status: AC
  Filled 2013-08-27: qty 2

## 2013-08-27 MED ORDER — MEPERIDINE HCL 100 MG/ML IJ SOLN
INTRAMUSCULAR | Status: DC | PRN
  Administered 2013-08-27 (×2): 50 mg via INTRAVENOUS

## 2013-08-27 NOTE — Discharge Instructions (Addendum)
°Colonoscopy °Discharge Instructions ° °Read the instructions outlined below and refer to this sheet in the next few weeks. These discharge instructions provide you with general information on caring for yourself after you leave the hospital. Your doctor may also give you specific instructions. While your treatment has been planned according to the most current medical practices available, unavoidable complications occasionally occur. If you have any problems or questions after discharge, call Dr. Rourk at [phone]. °ACTIVITY °· You may resume your regular activity, but move at a slower pace for the next 24 hours.  °· Take frequent rest periods for the next 24 hours.  °· Walking will help get rid of the air and reduce the bloated feeling in your belly (abdomen).  °· No driving for 24 hours (because of the medicine (anesthesia) used during the test).   °· Do not sign any important legal documents or operate any machinery for 24 hours (because of the anesthesia used during the test).  °NUTRITION °· Drink plenty of fluids.  °· You may resume your normal diet as instructed by your doctor.  °· Begin with a light meal and progress to your normal diet. Heavy or fried foods are harder to digest and may make you feel sick to your stomach (nauseated).  °· Avoid alcoholic beverages for 24 hours or as instructed.  °MEDICATIONS °· You may resume your normal medications unless your doctor tells you otherwise.  °WHAT YOU CAN EXPECT TODAY °· Some feelings of bloating in the abdomen.  °· Passage of more gas than usual.  °· Spotting of blood in your stool or on the toilet paper.  °IF YOU HAD POLYPS REMOVED DURING THE COLONOSCOPY: °· No aspirin products for 7 days or as instructed.  °· No alcohol for 7 days or as instructed.  °· Eat a soft diet for the next 24 hours.  °FINDING OUT THE RESULTS OF YOUR TEST °Not all test results are available during your visit. If your test results are not back during the visit, make an appointment  with your caregiver to find out the results. Do not assume everything is normal if you have not heard from your caregiver or the medical facility. It is important for you to follow up on all of your test results.  °SEEK IMMEDIATE MEDICAL ATTENTION IF: °· You have more than a spotting of blood in your stool.  °· Your belly is swollen (abdominal distention).  °· You are nauseated or vomiting.  °· You have a temperature over 101.  °· You have abdominal pain or discomfort that is severe or gets worse throughout the day.  ° ° °Diverticulosis and polyp information provided ° °Further recommendations to follow pending review of pathology report ° °Diverticulosis °Diverticulosis is a common condition that develops when small pouches (diverticula) form in the wall of the colon. The risk of diverticulosis increases with age. It happens more often in people who eat a low-fiber diet. Most individuals with diverticulosis have no symptoms. Those individuals with symptoms usually experience abdominal pain, constipation, or loose stools (diarrhea). °HOME CARE INSTRUCTIONS  °· Increase the amount of fiber in your diet as directed by your caregiver or dietician. This may reduce symptoms of diverticulosis. °· Your caregiver may recommend taking a dietary fiber supplement. °· Drink at least 6 to 8 glasses of water each day to prevent constipation. °· Try not to strain when you have a bowel movement. °· Your caregiver may recommend avoiding nuts and seeds to prevent complications, although this is   still an uncertain benefit. °· Only take over-the-counter or prescription medicines for pain, discomfort, or fever as directed by your caregiver. °FOODS WITH HIGH FIBER CONTENT INCLUDE: °· Fruits. Apple, peach, pear, tangerine, raisins, prunes. °· Vegetables. Brussels sprouts, asparagus, broccoli, cabbage, carrot, cauliflower, romaine lettuce, spinach, summer squash, tomato, winter squash, zucchini. °· Starchy Vegetables. Baked beans, kidney  beans, lima beans, split peas, lentils, potatoes (with skin). °· Grains. Whole wheat bread, brown rice, bran flake cereal, plain oatmeal, white rice, shredded wheat, bran muffins. °SEEK IMMEDIATE MEDICAL CARE IF:  °· You develop increasing pain or severe bloating. °· You have an oral temperature above 102° F (38.9° C), not controlled by medicine. °· You develop vomiting or bowel movements that are bloody or black. ° ° °Colon Polyps °Polyps are lumps of extra tissue growing inside the body. Polyps can grow in the large intestine (colon). Most colon polyps are noncancerous (benign). However, some colon polyps can become cancerous over time. Polyps that are larger than a pea may be harmful. To be safe, caregivers remove and test all polyps. °CAUSES  °Polyps form when mutations in the genes cause your cells to grow and divide even though no more tissue is needed. °RISK FACTORS °There are a number of risk factors that can increase your chances of getting colon polyps. They include: °· Being older than 50 years. °· Family history of colon polyps or colon cancer. °· Long-term colon diseases, such as colitis or Crohn disease. °· Being overweight. °· Smoking. °· Being inactive. °· Drinking too much alcohol. °SYMPTOMS  °Most small polyps do not cause symptoms. If symptoms are present, they may include: °· Blood in the stool. The stool may look dark red or black. °· Constipation or diarrhea that lasts longer than 1 week. °DIAGNOSIS °People often do not know they have polyps until their caregiver finds them during a regular checkup. Your caregiver can use 4 tests to check for polyps: °· Digital rectal exam. The caregiver wears gloves and feels inside the rectum. This test would find polyps only in the rectum. °· Barium enema. The caregiver puts a liquid called barium into your rectum before taking X-rays of your colon. Barium makes your colon look white. Polyps are dark, so they are easy to see in the X-ray  pictures. °· Sigmoidoscopy. A thin, flexible tube (sigmoidoscope) is placed into your rectum. The sigmoidoscope has a light and tiny camera in it. The caregiver uses the sigmoidoscope to look at the last third of your colon. °· Colonoscopy. This test is like sigmoidoscopy, but the caregiver looks at the entire colon. This is the most common method for finding and removing polyps. °TREATMENT  °Any polyps will be removed during a sigmoidoscopy or colonoscopy. The polyps are then tested for cancer. °PREVENTION  °To help lower your risk of getting more colon polyps: °· Eat plenty of fruits and vegetables. Avoid eating fatty foods. °· Do not smoke. °· Avoid drinking alcohol. °· Exercise every day. °· Lose weight if recommended by your caregiver. °· Eat plenty of calcium and folate. Foods that are rich in calcium include milk, cheese, and broccoli. Foods that are rich in folate include chickpeas, kidney beans, and spinach. °HOME CARE INSTRUCTIONS °Keep all follow-up appointments as directed by your caregiver. You may need periodic exams to check for polyps. °SEEK MEDICAL CARE IF: °You notice bleeding during a bowel movement. ° °

## 2013-08-27 NOTE — Op Note (Signed)
[facility] [address] [place], [address]   COLONOSCOPY PROCEDURE REPORT  PATIENT: Eric Johnson, Eric Johnson  MR#:         [id number] BIRTHDATE: 1960-11-22 , 69  yrs. old GENDER: Male ENDOSCOPIST: R.  Garfield Cornea, MD FACP FACG REFERRED BY:  Jenna Luo, M.D. PROCEDURE DATE:  08/27/2013 PROCEDURE:     Colonoscopy with multiple snare polypectomies  INDICATIONS: Average risk colorectal cancer screening examination  INFORMED CONSENT:  The risks, benefits, alternatives and imponderables including but not limited to bleeding, perforation as well as the possibility of a missed lesion have been reviewed.  The potential for biopsy, lesion removal, etc. have also been discussed.  Questions have been answered.  All parties agreeable. Please see the history and physical in the medical record for more information.  MEDICATIONS: Versed 6 mg IV and Demerol 100 mg IV in divided doses. Zofran 4 mg IV.  DESCRIPTION OF PROCEDURE:  After a digital rectal exam was performed, the EC-[id number] ([id number])  colonoscope was advanced from the anus through the rectum and colon to the area of the cecum, ileocecal valve and appendiceal orifice.  The cecum was deeply intubated.  These structures were well-seen and photographed for the record.  From the level of the cecum and ileocecal valve, the scope was slowly and cautiously withdrawn.  The mucosal surfaces were carefully surveyed utilizing scope tip deflection to facilitate fold flattening as needed.  The scope was pulled down into the rectum where a thorough examination including retroflexion was performed.    FINDINGS:  Adequate preparation. Normal rectum. Somewhat of a redundant colon. Scattered left-sided diverticula; the patient had (1)4 mm polyp in the ascending segment; (1)  9 mm polyp in at the splenic flexure and (1) 6 mm polyp in the mid descending segments; The remainder of the colonic mucosa appeared normal.  THERAPEUTIC /  DIAGNOSTIC MANEUVERS PERFORMED:  The above-mentioned polyps were cold or hot snare removed.  COMPLICATIONS: none  CECAL WITHDRAWAL TIME:  14 minutes  IMPRESSION:  Multiple colonic polyps-removed as described above. Colonic diverticulosis.  RECOMMENDATIONS: Followup on pathology.   _______________________________ eSigned:  R. Garfield Cornea, MD FACP [facility] 08/27/2013 11:49 AM   CC:    PATIENT NAME:  Eric Johnson, Eric Johnson MR#: [id number]

## 2013-08-27 NOTE — H&P (Signed)
@LOGO @   Primary Care Physician:  Odette Fraction, MD Primary Gastroenterologist:  Dr. Gala Romney  Pre-Procedure History & Physical: HPI:  Eric Johnson is a 53 y.o. male is here for a screening colonoscopy. No bowel symptoms. No family history of colon cancer. Had a colonoscopy for obscure reasons 15 years ago in [place]. Reportedly polyps removed but there were benign according to pt.  Past Medical History  Diagnosis Date  . Bronchitis   . Diabetes mellitus   . Hypercholesteremia   . Hypertension   . Morbid obesity 10/30/2012    Past Surgical History  Procedure Laterality Date  . Kidney stone removal    . Colonoscopy w/ polypectomy  2000    Prior to Admission medications   Medication Sig Start Date End Date Taking? Authorizing Provider  amLODipine (NORVASC) 10 MG tablet Take 1 tablet (10 mg total) by mouth daily. 07/16/13  Yes [name], MD  beta carotene w/minerals (OCUVITE) tablet Take 1 tablet by mouth daily.   Yes Historical Provider, MD  Cyanocobalamin (VITAMIN B 12 PO) Take by mouth.   Yes Historical Provider, MD  hydrochlorothiazide (HYDRODIURIL) 25 MG tablet Take 1 tablet (25 mg total) by mouth daily. 07/16/13  Yes [name], MD  metFORMIN (GLUCOPHAGE) 500 MG tablet TAKE ONE TABLET BY MOUTH TWICE DAILY 03/23/13  Yes [name], MD  peg 3350 powder (MOVIPREP) 100 G SOLR Take 1 kit (200 g total) by mouth as directed. 08/11/13  Yes Daneil Dolin, MD  saw palmetto 160 MG capsule Take 160 mg by mouth daily.    Yes Historical Provider, MD  simvastatin (ZOCOR) 40 MG tablet Take 1 tablet (40 mg total) by mouth at bedtime. 07/16/13  Yes [name], MD  losartan (COZAAR) 100 MG tablet Take 1 tablet (100 mg total) by mouth daily. 07/30/13   [name], MD    Allergies as of 08/05/2013 - Review Complete 08/05/2013  Allergen Reaction Noted  . Penicillins  06/28/2011    Family History  Problem Relation Age of Onset  . Heart Problems Mother      History   Social History  . Marital Status: Married    Spouse Name: N/A    Number of Children: N/A  . Years of Education: N/A   Occupational History  . Not on file.   Social History Main Topics  . Smoking status: Current Some Day Smoker -- 5 years    Types: Cigars  . Smokeless tobacco: Not on file  . Alcohol Use: Yes     Comment: Occ wine  . Drug Use: No  . Sexual Activity: Not on file   Other Topics Concern  . Not on file   Social History Narrative  . No narrative on file    Review of Systems: See HPI, otherwise negative ROS  Physical Exam: BP 135/72  Pulse 99  Temp(Src) 98.2 F (36.8 C) (Oral)  Resp 22  Ht 5' 11"  (1.803 m)  Wt 340 lb (154.223 kg)  BMI 47.44 kg/m2  SpO2 95% General:   Alert,  Well-developed, well-nourished, pleasant and cooperative in NAD Head:  Normocephalic and atraumatic. Eyes:  Sclera clear, no icterus.   Conjunctiva pink. Ears:  Normal auditory acuity. Nose:  No deformity, discharge,  or lesions. Mouth:  No deformity or lesions, dentition normal. Neck:  Supple; no masses or thyromegaly. Lungs:  Clear throughout to auscultation.   No wheezes, crackles, or rhonchi. No acute distress. Heart:  Regular rate and  rhythm; no murmurs, clicks, rubs,  or gallops. Abdomen:  Obese  Soft, nontender and nondistended. No masses, hepatosplenomegaly or hernias noted. Normal bowel sounds, without guarding, and without rebound.   Msk:  Symmetrical without gross deformities. Normal posture. Pulses:  Normal pulses noted. Extremities:  Without clubbing or edema.  Impression/Plan: Eric Johnson is now here to undergo a screening colonoscopy. Average risk screening examination.  Risks, benefits, limitations, imponderables and alternatives regarding colonoscopy have been reviewed with the patient. Questions have been answered. All parties agreeable.        Notice:  This dictation was prepared with Dragon dictation along with smaller phrase technology.  Any transcriptional errors that result from this process are unintentional and may not be corrected upon review.

## 2013-08-30 ENCOUNTER — Encounter: Payer: Self-pay | Admitting: Internal Medicine

## 2013-09-13 ENCOUNTER — Telehealth: Payer: Self-pay | Admitting: General Practice

## 2013-09-13 NOTE — Telephone Encounter (Signed)
Patient called in with a question concerning his bill for his tcs.  He stated that he was told by [facility] that one was paid as wellness and the other one was not.  I spoke with Alisia Ferrari, coder for [facility] and she stated that his bill was coded correctly due to his H/o Polyps.

## 2013-09-14 NOTE — Telephone Encounter (Signed)
I made the patient's wife aware.

## 2013-09-16 ENCOUNTER — Encounter: Payer: Self-pay | Admitting: Family Medicine

## 2013-09-16 MED ORDER — METFORMIN HCL 500 MG PO TABS: ORAL_TABLET | ORAL | Status: DC

## 2013-09-16 NOTE — Telephone Encounter (Signed)
Refill appropriate and filled per protocol. 

## 2013-09-27 ENCOUNTER — Other Ambulatory Visit: Payer: Self-pay | Admitting: Family Medicine

## 2013-09-28 MED ORDER — SIMVASTATIN 40 MG PO TABS: 40.0000 mg | ORAL_TABLET | Freq: Every day | ORAL | Status: DC

## 2013-09-28 MED ORDER — AMLODIPINE BESYLATE 10 MG PO TABS: 10.0000 mg | ORAL_TABLET | Freq: Every day | ORAL | Status: DC

## 2013-12-20 ENCOUNTER — Encounter: Payer: Self-pay | Admitting: Family Medicine

## 2014-01-28 ENCOUNTER — Ambulatory Visit (HOSPITAL_COMMUNITY)
Admission: RE | Admit: 2014-01-28 | Discharge: 2014-01-28 | Disposition: A | Discharge status: Home / Self Care | Payer: BC Managed Care – PPO | Source: Ambulatory Visit | Attending: Family Medicine | Admitting: Family Medicine

## 2014-01-28 ENCOUNTER — Other Ambulatory Visit (HOSPITAL_COMMUNITY): Payer: Self-pay | Admitting: Family Medicine

## 2014-01-28 DIAGNOSIS — M7989 Other specified soft tissue disorders: Secondary | ICD-10-CM | POA: Insufficient documentation

## 2014-01-28 DIAGNOSIS — M79662 Pain in left lower leg: Secondary | ICD-10-CM | POA: Diagnosis not present

## 2014-01-28 DIAGNOSIS — M79609 Pain in unspecified limb: Secondary | ICD-10-CM

## 2014-01-28 NOTE — Progress Notes (Signed)
Left lower extremity venous duplex completed.  Left:  No evidence of DVT, superficial thrombosis, or Baker's cyst.  Right:  Negative for DVT in the common femoral vein.  

## 2014-03-11 ENCOUNTER — Encounter: Payer: Self-pay | Admitting: Family Medicine

## 2014-03-11 MED ORDER — METFORMIN HCL 500 MG PO TABS: ORAL_TABLET | ORAL | Status: DC

## 2014-03-11 NOTE — Telephone Encounter (Signed)
Medication filled x1 with no refills.   Requires office visit before any further refills can be given.   Patient made aware via MyChart.

## 2014-03-30 ENCOUNTER — Encounter: Payer: Self-pay | Admitting: Family Medicine

## 2014-03-31 MED ORDER — SIMVASTATIN 40 MG PO TABS
40.0000 mg | ORAL_TABLET | Freq: Every day | ORAL | Status: DC
Start: 2014-03-31 — End: 2014-10-06

## 2014-03-31 MED ORDER — AMLODIPINE BESYLATE 10 MG PO TABS: 10.0000 mg | ORAL_TABLET | Freq: Every day | ORAL | Status: DC

## 2014-04-11 ENCOUNTER — Encounter: Payer: Self-pay | Admitting: Family Medicine

## 2014-04-14 ENCOUNTER — Other Ambulatory Visit: Payer: Self-pay | Admitting: Family Medicine

## 2014-04-14 ENCOUNTER — Other Ambulatory Visit: Payer: 59

## 2014-04-14 DIAGNOSIS — Z0000 Encounter for general adult medical examination without abnormal findings: Secondary | ICD-10-CM

## 2014-04-14 DIAGNOSIS — Z79899 Other long term (current) drug therapy: Secondary | ICD-10-CM

## 2014-04-14 DIAGNOSIS — Z125 Encounter for screening for malignant neoplasm of prostate: Secondary | ICD-10-CM

## 2014-04-14 DIAGNOSIS — E119 Type 2 diabetes mellitus without complications: Secondary | ICD-10-CM

## 2014-04-14 DIAGNOSIS — E7800 Pure hypercholesterolemia, unspecified: Secondary | ICD-10-CM

## 2014-04-14 DIAGNOSIS — I10 Essential (primary) hypertension: Secondary | ICD-10-CM

## 2014-04-14 LAB — CBC WITH DIFFERENTIAL/PLATELET
Basophils Absolute: 0.1 10*3/uL (ref 0.0–0.1)
Basophils Relative: 1 % (ref 0–1)
Eosinophils Absolute: 0.2 10*3/uL (ref 0.0–0.7)
Eosinophils Relative: 3 % (ref 0–5)
HCT: 42 % (ref 39.0–52.0)
Hemoglobin: 14.1 g/dL (ref 13.0–17.0)
Lymphocytes Relative: 27 % (ref 12–46)
Lymphs Abs: 1.7 10*3/uL (ref 0.7–4.0)
MCH: 32 pg (ref 26.0–34.0)
MCHC: 33.6 g/dL (ref 30.0–36.0)
MCV: 95.2 fL (ref 78.0–100.0)
MPV: 9.9 fL (ref 8.6–12.4)
Monocytes Absolute: 0.8 10*3/uL (ref 0.1–1.0)
Monocytes Relative: 13 % — ABNORMAL HIGH (ref 3–12)
Neutro Abs: 3.5 10*3/uL (ref 1.7–7.7)
Neutrophils Relative %: 56 % (ref 43–77)
Platelets: 295 10*3/uL (ref 150–400)
RBC: 4.41 MIL/uL (ref 4.22–5.81)
RDW: 13.8 % (ref 11.5–15.5)
WBC: 6.3 10*3/uL (ref 4.0–10.5)

## 2014-04-14 LAB — LIPID PANEL
Cholesterol: 116 mg/dL (ref 0–200)
HDL: 31 mg/dL — ABNORMAL LOW (ref >39)
LDL Cholesterol: 52 mg/dL (ref 0–99)
Total CHOL/HDL Ratio: 3.7 Ratio
Triglycerides: 166 mg/dL — ABNORMAL HIGH (ref <150)
VLDL: 33 mg/dL (ref 0–40)

## 2014-04-14 LAB — COMPLETE METABOLIC PANEL WITH GFR
ALT: 22 U/L (ref 0–53)
AST: 19 U/L (ref 0–37)
Albumin: 3.8 g/dL (ref 3.5–5.2)
Alkaline Phosphatase: 65 U/L (ref 39–117)
BUN: 15 mg/dL (ref 6–23)
CO2: 25 mEq/L (ref 19–32)
Calcium: 9 mg/dL (ref 8.4–10.5)
Chloride: 104 mEq/L (ref 96–112)
Creat: 0.87 mg/dL (ref 0.50–1.35)
GFR, Est African American: >89 mL/min
GFR, Est Non African American: >89 mL/min
Glucose, Bld: 134 mg/dL — ABNORMAL HIGH (ref 70–99)
Potassium: 4.3 mEq/L (ref 3.5–5.3)
Sodium: 139 mEq/L (ref 135–145)
Total Bilirubin: 0.5 mg/dL (ref 0.2–1.2)
Total Protein: 6.8 g/dL (ref 6.0–8.3)

## 2014-04-14 LAB — TSH: TSH: 2.852 u[IU]/mL (ref 0.350–4.500)

## 2014-04-15 ENCOUNTER — Other Ambulatory Visit: Payer: Self-pay | Admitting: *Deleted

## 2014-04-15 LAB — PSA: PSA: 0.85 ng/mL (ref <=4.00)

## 2014-04-18 ENCOUNTER — Ambulatory Visit (INDEPENDENT_AMBULATORY_CARE_PROVIDER_SITE_OTHER): Payer: 59 | Admitting: Family Medicine

## 2014-04-18 ENCOUNTER — Encounter: Payer: Self-pay | Admitting: Family Medicine

## 2014-04-18 VITALS — BP 130/78 | HR 88 | Temp 98.4°F | Resp 14 | Ht 71.0 in | Wt 351.0 lb

## 2014-04-18 DIAGNOSIS — R599 Enlarged lymph nodes, unspecified: Secondary | ICD-10-CM

## 2014-04-18 DIAGNOSIS — R590 Localized enlarged lymph nodes: Secondary | ICD-10-CM

## 2014-04-18 DIAGNOSIS — Z0000 Encounter for general adult medical examination without abnormal findings: Secondary | ICD-10-CM

## 2014-04-18 LAB — HEMOGLOBIN A1C
Hgb A1c MFr Bld: 6.7 % — ABNORMAL HIGH (ref <5.7)
Mean Plasma Glucose: 146 mg/dL — ABNORMAL HIGH (ref <117)

## 2014-04-18 NOTE — Progress Notes (Signed)
Subjective:    Patient ID: Eric Johnson, male    DOB: 1961-02-27, 54 y.o.   MRN: [id number]  HPI Patient is here today for complete physical exam. He underwent arthroscopic left knee surgery in October for a torn meniscus. Afterwards he has significant edema in his left leg. Venous ultrasound was negative for DVT although it did reveal an enlarged lymph node in the left inguinal crease. On physical exam today, the patient has marked lymphedema in his left leg distal to the knee. It is tense +2 and pitting. Calf circumference is 58 cm when measured 10 cm below the patella. This is compared only 48 cm on his right side.  Patient also has witnessed sleep apnea and snores heavily however at the present time he is not interested in going for a sleep study. His colonoscopy was performed last year and was significant for polyps. His gastroenterologist recommended a repeat colonoscopy in 3-5 years. Prostate exam is performed today and is normal. PSA is undetectable. Lab on 04/14/2014  Component Date Value Ref Range Status  . Sodium 04/14/2014 139  135 - 145 mEq/L Final  . Potassium 04/14/2014 4.3  3.5 - 5.3 mEq/L Final  . Chloride 04/14/2014 104  96 - 112 mEq/L Final  . CO2 04/14/2014 25  19 - 32 mEq/L Final  . Glucose, Bld 04/14/2014 134* 70 - 99 mg/dL Final  . BUN 04/14/2014 15  6 - 23 mg/dL Final  . Creat 04/14/2014 0.87  0.50 - 1.35 mg/dL Final  . Total Bilirubin 04/14/2014 0.5  0.2 - 1.2 mg/dL Final  . Alkaline Phosphatase 04/14/2014 65  39 - 117 U/L Final  . AST 04/14/2014 19  0 - 37 U/L Final  . ALT 04/14/2014 22  0 - 53 U/L Final  . Total Protein 04/14/2014 6.8  6.0 - 8.3 g/dL Final  . Albumin 04/14/2014 3.8  3.5 - 5.2 g/dL Final  . Calcium 04/14/2014 9.0  8.4 - 10.5 mg/dL Final  . GFR, Est African American 04/14/2014 >89   Final  . GFR, Est Non African American 04/14/2014 >89   Final   Comment:   The estimated GFR is a calculation valid for adults (>=24 years old) that uses the CKD-EPI  algorithm to adjust for age and sex. It is   not to be used for children, pregnant women, hospitalized patients,    patients on dialysis, or with rapidly changing kidney function. According to the NKDEP, eGFR >89 is normal, 60-89 shows mild impairment, 30-59 shows moderate impairment, 15-29 shows severe impairment and <15 is ESRD.     . TSH 04/14/2014 2.852  0.350 - 4.500 uIU/mL Final  . Cholesterol 04/14/2014 116  0 - 200 mg/dL Final   Comment: ATP III Classification:       < 200        mg/dL        Desirable      200 - 239     mg/dL        Borderline High      >= 240        mg/dL        High     . Triglycerides 04/14/2014 166* <150 mg/dL Final  . HDL 04/14/2014 31* >39 mg/dL Final  . Total CHOL/HDL Ratio 04/14/2014 3.7   Final  . VLDL 04/14/2014 33  0 - 40 mg/dL Final  . LDL Cholesterol 04/14/2014 52  0 - 99 mg/dL Final   Comment:   Total  Cholesterol/HDL Ratio:CHD Risk                        Coronary Heart Disease Risk Table                                        Men       Women          1/2 Average Risk              3.4        3.3              Average Risk              5.0        4.4           2X Average Risk              9.6        7.1           3X Average Risk             23.4       11.0 Use the calculated Patient Ratio above and the CHD Risk table  to determine the patient's CHD Risk. ATP III Classification (LDL):       < 100        mg/dL         Optimal      100 - 129     mg/dL         Near or Above Optimal      130 - 159     mg/dL         Borderline High      160 - 189     mg/dL         High       > 190        mg/dL         Very High     . WBC 04/14/2014 6.3  4.0 - 10.5 K/uL Final  . RBC 04/14/2014 4.41  4.22 - 5.81 MIL/uL Final  . Hemoglobin 04/14/2014 14.1  13.0 - 17.0 g/dL Final  . HCT 04/14/2014 42.0  39.0 - 52.0 % Final  . MCV 04/14/2014 95.2  78.0 - 100.0 fL Final  . MCH 04/14/2014 32.0  26.0 - 34.0 pg Final  . MCHC 04/14/2014 33.6  30.0 - 36.0 g/dL Final    . RDW 04/14/2014 13.8  11.5 - 15.5 % Final  . Platelets 04/14/2014 295  150 - 400 K/uL Final  . MPV 04/14/2014 9.9  8.6 - 12.4 fL Final   ** Please note change in reference range(s). **  . Neutrophils Relative % 04/14/2014 56  43 - 77 % Final  . Neutro Abs 04/14/2014 3.5  1.7 - 7.7 K/uL Final  . Lymphocytes Relative 04/14/2014 27  12 - 46 % Final  . Lymphs Abs 04/14/2014 1.7  0.7 - 4.0 K/uL Final  . Monocytes Relative 04/14/2014 13* 3 - 12 % Final  . Monocytes Absolute 04/14/2014 0.8  0.1 - 1.0 K/uL Final  . Eosinophils Relative 04/14/2014 3  0 - 5 % Final  . Eosinophils Absolute 04/14/2014 0.2  0.0 - 0.7 K/uL Final  . Basophils Relative 04/14/2014 1  0 - 1 % Final  . Basophils Absolute 04/14/2014 0.1  0.0 - 0.1 K/uL Final  . Smear Review 04/14/2014 Criteria for review not met   Final  . PSA 04/14/2014 0.85  <=4.00 ng/mL Final   Comment: Test Methodology: ECLIA PSA (Electrochemiluminescence Immunoassay)   For PSA values from 2.5-4.0, particularly in younger men <67 years old, the AUA and NCCN suggest testing for % Free PSA (3515) and evaluation of the rate of increase in PSA (PSA velocity).    Past Medical History  Diagnosis Date  . Bronchitis   . Diabetes mellitus   . Hypercholesteremia   . Hypertension   . Morbid obesity 10/30/2012   Past Surgical History  Procedure Laterality Date  . Kidney stone removal    . Colonoscopy w/ polypectomy  2000  . Colonoscopy N/A 08/27/2013    Procedure: COLONOSCOPY;  Surgeon: Daneil Dolin, MD;  Location: [facility];  Service: Endoscopy;  Laterality: N/A;  11:30 AM   Current Outpatient Prescriptions on File Prior to Visit  Medication Sig Dispense Refill  . amLODipine (NORVASC) 10 MG tablet Take 1 tablet (10 mg total) by mouth daily. 90 tablet 1  . beta carotene w/minerals (OCUVITE) tablet Take 1 tablet by mouth daily.    . Cyanocobalamin (VITAMIN B 12 PO) Take by mouth.    . losartan (COZAAR) 100 MG tablet Take 1 tablet (100 mg total)  by mouth daily. 90 tablet 3  . metFORMIN (GLUCOPHAGE) 500 MG tablet TAKE ONE TABLET BY MOUTH TWICE DAILY 180 tablet 0  . saw palmetto 160 MG capsule Take 160 mg by mouth daily.     . simvastatin (ZOCOR) 40 MG tablet Take 1 tablet (40 mg total) by mouth at bedtime. 90 tablet 1   No current facility-administered medications on file prior to visit.   Allergies  Allergen Reactions  . Penicillins    History   Social History  . Marital Status: Married    Spouse Name: N/A    Number of Children: N/A  . Years of Education: N/A   Occupational History  . Not on file.   Social History Main Topics  . Smoking status: Current Some Day Smoker -- 5 years    Types: Cigars  . Smokeless tobacco: Not on file  . Alcohol Use: Yes     Comment: Occ wine  . Drug Use: No  . Sexual Activity: Not on file   Other Topics Concern  . Not on file   Social History Narrative   Family History  Problem Relation Age of Onset  . Heart Problems Mother       Review of Systems  All other systems reviewed and are negative.      Objective:   Physical Exam  Constitutional: He is oriented to person, place, and time. He appears well-developed and well-nourished. No distress.  HENT:  Head: Normocephalic and atraumatic.  Right Ear: External ear normal.  Left Ear: External ear normal.  Nose: Nose normal.  Mouth/Throat: Oropharynx is clear and moist. No oropharyngeal exudate.  Eyes: Conjunctivae and EOM are normal. Pupils are equal, round, and reactive to light. Right eye exhibits no discharge. Left eye exhibits no discharge. No scleral icterus.  Neck: Normal range of motion. Neck supple. No JVD present. No tracheal deviation present. No thyromegaly present.  Cardiovascular: Normal rate, regular rhythm, normal heart sounds and intact distal pulses.  Exam reveals no gallop and no friction rub.   No murmur heard. Pulmonary/Chest: Effort normal and breath sounds normal. No stridor. No respiratory distress. He  has no  wheezes. He has no rales. He exhibits no tenderness.  Abdominal: Soft. Bowel sounds are normal. He exhibits no distension. There is no tenderness. There is no rebound and no guarding.  Genitourinary: Rectum normal and prostate normal.  Musculoskeletal: He exhibits edema.  Lymphadenopathy:    He has no cervical adenopathy.  Neurological: He is alert and oriented to person, place, and time. He displays normal reflexes. No cranial nerve deficit. He exhibits normal muscle tone. Coordination normal.  Skin: Skin is warm. No rash noted. He is not diaphoretic. No erythema. No pallor.  Psychiatric: He has a normal mood and affect. His behavior is normal. Judgment and thought content normal.  Vitals reviewed.         Assessment & Plan:  Routine general medical examination at a health care facility  Inguinal lymphadenopathy - Plan: CT Abdomen Pelvis W Contrast  Physical exam is significant for morbid obesity and markedly asymmetric edema in his left leg. This is consistent with lymphedema. However given the enlarged lymph node seen in the left inguinal crease I will proceed with a CT scan to rule out obstruction and the lymphatic system from lymphoma. If the CT scan is normal, I would recommend discontinuing amlodipine and switching the patient to Lasix. I also recommended compressive stockings and also a sleep study to evaluate for sleep apnea as this may contribute to cor pulmonale.  Immunizations are up-to-date. The remainder of his cancer screening is up-to-date.  I will also add a hemoglobin A1c. I recommended diet exercise and weight loss.

## 2014-04-19 ENCOUNTER — Encounter: Payer: Self-pay | Admitting: *Deleted

## 2014-04-25 ENCOUNTER — Ambulatory Visit (HOSPITAL_COMMUNITY)
Admission: RE | Admit: 2014-04-25 | Discharge: 2014-04-25 | Disposition: A | Discharge status: Home / Self Care | Payer: 59 | Source: Ambulatory Visit | Attending: Family Medicine | Admitting: Family Medicine

## 2014-04-25 DIAGNOSIS — M7989 Other specified soft tissue disorders: Secondary | ICD-10-CM | POA: Diagnosis not present

## 2014-04-25 DIAGNOSIS — K4090 Unilateral inguinal hernia, without obstruction or gangrene, not specified as recurrent: Secondary | ICD-10-CM | POA: Insufficient documentation

## 2014-04-25 DIAGNOSIS — K5730 Diverticulosis of large intestine without perforation or abscess without bleeding: Secondary | ICD-10-CM | POA: Diagnosis not present

## 2014-04-25 DIAGNOSIS — R911 Solitary pulmonary nodule: Secondary | ICD-10-CM | POA: Diagnosis not present

## 2014-04-25 DIAGNOSIS — R590 Localized enlarged lymph nodes: Secondary | ICD-10-CM | POA: Insufficient documentation

## 2014-04-25 MED ORDER — IOHEXOL 300 MG/ML  SOLN
125.0000 mL | Freq: Once | INTRAMUSCULAR | Status: AC | PRN
  Administered 2014-04-25: 125 mL via INTRAVENOUS

## 2014-04-26 ENCOUNTER — Other Ambulatory Visit: Payer: Self-pay | Admitting: *Deleted

## 2014-04-26 MED ORDER — FUROSEMIDE 40 MG PO TABS: 40.0000 mg | ORAL_TABLET | Freq: Every day | ORAL | Status: DC

## 2014-04-26 MED ORDER — POTASSIUM CHLORIDE CRYS ER 20 MEQ PO TBCR: 20.0000 meq | EXTENDED_RELEASE_TABLET | Freq: Every day | ORAL | Status: DC

## 2014-05-05 ENCOUNTER — Ambulatory Visit: Payer: Self-pay | Admitting: Family Medicine

## 2014-05-06 ENCOUNTER — Encounter: Payer: Self-pay | Admitting: Family Medicine

## 2014-05-06 ENCOUNTER — Ambulatory Visit (INDEPENDENT_AMBULATORY_CARE_PROVIDER_SITE_OTHER): Payer: 59 | Admitting: Family Medicine

## 2014-05-06 VITALS — BP 122/64 | HR 80 | Temp 97.8°F | Resp 20 | Ht 71.0 in | Wt 348.0 lb

## 2014-05-06 DIAGNOSIS — R911 Solitary pulmonary nodule: Secondary | ICD-10-CM

## 2014-05-06 DIAGNOSIS — I890 Lymphedema, not elsewhere classified: Secondary | ICD-10-CM

## 2014-05-07 LAB — BASIC METABOLIC PANEL
BUN: 16 mg/dL (ref 6–23)
CO2: 31 mEq/L (ref 19–32)
Calcium: 9.2 mg/dL (ref 8.4–10.5)
Chloride: 104 mEq/L (ref 96–112)
Creat: 0.89 mg/dL (ref 0.50–1.35)
Glucose, Bld: 117 mg/dL — ABNORMAL HIGH (ref 70–99)
Potassium: 3.8 mEq/L (ref 3.5–5.3)
Sodium: 140 mEq/L (ref 135–145)

## 2014-05-08 ENCOUNTER — Encounter: Payer: Self-pay | Admitting: Family Medicine

## 2014-05-08 NOTE — Progress Notes (Signed)
Subjective:    Patient ID: Eric Johnson, male    DOB: 10/30/1960, 54 y.o.   MRN: [id number]  HPI 1/181/6 Patient is here today for complete physical exam. He underwent arthroscopic left knee surgery in October for a torn meniscus. Afterwards he has significant edema in his left leg. Venous ultrasound was negative for DVT although it did reveal an enlarged lymph node in the left inguinal crease. On physical exam today, the patient has marked lymphedema in his left leg distal to the knee. It is tense +2 and pitting. Calf circumference is 58 cm when measured 10 cm below the patella. This is compared only 48 cm on his right side.  Patient also has witnessed sleep apnea and snores heavily however at the present time he is not interested in going for a sleep study. His colonoscopy was performed last year and was significant for polyps. His gastroenterologist recommended a repeat colonoscopy in 3-5 years. Prostate exam is performed today and is normal. PSA is undetectable.  At that time,  My plan was:  Physical exam is significant for morbid obesity and markedly asymmetric edema in his left leg. This is consistent with lymphedema. However given the enlarged lymph node seen in the left inguinal crease I will proceed with a CT scan to rule out obstruction and the lymphatic system from lymphoma. If the CT scan is normal, I would recommend discontinuing amlodipine and switching the patient to Lasix. I also recommended compressive stockings and also a sleep study to evaluate for sleep apnea as this may contribute to cor pulmonale.  Immunizations are up-to-date. The remainder of his cancer screening is up-to-date.  I will also add a hemoglobin A1c. I recommended diet exercise and weight loss.  05/08/14 CT scan revealed no pelvic lymphadenopathy. Since discontinuing amlodipine and starting Lasix, the patient has lost 3 pounds and the edema in his left leg is better. It is now +1. He is also being compliant wearing  compression stockings that are knee-high. There are several coincidental findings on his CT scan including a 5 mm left pulmonary nodule. He is a smoker. He is otherwise asymptomatic. It also found a renal cyst as well as a small left inguinal hernia both of which are benign and asymptomatic. Office Visit on 05/06/2014  Component Date Value Ref Range Status  . Sodium 05/06/2014 140  135 - 145 mEq/L Final  . Potassium 05/06/2014 3.8  3.5 - 5.3 mEq/L Final  . Chloride 05/06/2014 104  96 - 112 mEq/L Final  . CO2 05/06/2014 31  19 - 32 mEq/L Final  . Glucose, Bld 05/06/2014 117* 70 - 99 mg/dL Final  . BUN 05/06/2014 16  6 - 23 mg/dL Final  . Creat 05/06/2014 0.89  0.50 - 1.35 mg/dL Final  . Calcium 05/06/2014 9.2  8.4 - 10.5 mg/dL Final  Lab on 04/14/2014  Component Date Value Ref Range Status  . Sodium 04/14/2014 139  135 - 145 mEq/L Final  . Potassium 04/14/2014 4.3  3.5 - 5.3 mEq/L Final  . Chloride 04/14/2014 104  96 - 112 mEq/L Final  . CO2 04/14/2014 25  19 - 32 mEq/L Final  . Glucose, Bld 04/14/2014 134* 70 - 99 mg/dL Final  . BUN 04/14/2014 15  6 - 23 mg/dL Final  . Creat 04/14/2014 0.87  0.50 - 1.35 mg/dL Final  . Total Bilirubin 04/14/2014 0.5  0.2 - 1.2 mg/dL Final  . Alkaline Phosphatase 04/14/2014 65  39 - 117 U/L Final  .  AST 04/14/2014 19  0 - 37 U/L Final  . ALT 04/14/2014 22  0 - 53 U/L Final  . Total Protein 04/14/2014 6.8  6.0 - 8.3 g/dL Final  . Albumin 04/14/2014 3.8  3.5 - 5.2 g/dL Final  . Calcium 04/14/2014 9.0  8.4 - 10.5 mg/dL Final  . GFR, Est African American 04/14/2014 >89   Final  . GFR, Est Non African American 04/14/2014 >89   Final   Comment:   The estimated GFR is a calculation valid for adults (>=11 years old) that uses the CKD-EPI algorithm to adjust for age and sex. It is   not to be used for children, pregnant women, hospitalized patients,    patients on dialysis, or with rapidly changing kidney function. According to the NKDEP, eGFR >89 is normal,  60-89 shows mild impairment, 30-59 shows moderate impairment, 15-29 shows severe impairment and <15 is ESRD.     . TSH 04/14/2014 2.852  0.350 - 4.500 uIU/mL Final  . Cholesterol 04/14/2014 116  0 - 200 mg/dL Final   Comment: ATP III Classification:       < 200        mg/dL        Desirable      200 - 239     mg/dL        Borderline High      >= 240        mg/dL        High     . Triglycerides 04/14/2014 166* <150 mg/dL Final  . HDL 04/14/2014 31* >39 mg/dL Final  . Total CHOL/HDL Ratio 04/14/2014 3.7   Final  . VLDL 04/14/2014 33  0 - 40 mg/dL Final  . LDL Cholesterol 04/14/2014 52  0 - 99 mg/dL Final   Comment:   Total Cholesterol/HDL Ratio:CHD Risk                        Coronary Heart Disease Risk Table                                        Men       Women          1/2 Average Risk              3.4        3.3              Average Risk              5.0        4.4           2X Average Risk              9.6        7.1           3X Average Risk             23.4       11.0 Use the calculated Patient Ratio above and the CHD Risk table  to determine the patient's CHD Risk. ATP III Classification (LDL):       < 100        mg/dL         Optimal      100 - 129     mg/dL         Near  or Above Optimal      130 - 159     mg/dL         Borderline High      160 - 189     mg/dL         High       > 190        mg/dL         Very High     . WBC 04/14/2014 6.3  4.0 - 10.5 K/uL Final  . RBC 04/14/2014 4.41  4.22 - 5.81 MIL/uL Final  . Hemoglobin 04/14/2014 14.1  13.0 - 17.0 g/dL Final  . HCT 04/14/2014 42.0  39.0 - 52.0 % Final  . MCV 04/14/2014 95.2  78.0 - 100.0 fL Final  . MCH 04/14/2014 32.0  26.0 - 34.0 pg Final  . MCHC 04/14/2014 33.6  30.0 - 36.0 g/dL Final  . RDW 04/14/2014 13.8  11.5 - 15.5 % Final  . Platelets 04/14/2014 295  150 - 400 K/uL Final  . MPV 04/14/2014 9.9  8.6 - 12.4 fL Final   ** Please note change in reference range(s). **  . Neutrophils Relative % 04/14/2014  56  43 - 77 % Final  . Neutro Abs 04/14/2014 3.5  1.7 - 7.7 K/uL Final  . Lymphocytes Relative 04/14/2014 27  12 - 46 % Final  . Lymphs Abs 04/14/2014 1.7  0.7 - 4.0 K/uL Final  . Monocytes Relative 04/14/2014 13* 3 - 12 % Final  . Monocytes Absolute 04/14/2014 0.8  0.1 - 1.0 K/uL Final  . Eosinophils Relative 04/14/2014 3  0 - 5 % Final  . Eosinophils Absolute 04/14/2014 0.2  0.0 - 0.7 K/uL Final  . Basophils Relative 04/14/2014 1  0 - 1 % Final  . Basophils Absolute 04/14/2014 0.1  0.0 - 0.1 K/uL Final  . Smear Review 04/14/2014 Criteria for review not met   Final  . PSA 04/14/2014 0.85  <=4.00 ng/mL Final   Comment: Test Methodology: ECLIA PSA (Electrochemiluminescence Immunoassay)   For PSA values from 2.5-4.0, particularly in younger men <16 years old, the AUA and NCCN suggest testing for % Free PSA (3515) and evaluation of the rate of increase in PSA (PSA velocity).   Orders Only on 04/14/2014  Component Date Value Ref Range Status  . Hgb A1c MFr Bld 04/14/2014 6.7* <5.7 % Final   Comment:                                                                        According to the ADA Clinical Practice Recommendations for 2011, when HbA1c is used as a screening test:     >=6.5%   Diagnostic of Diabetes Mellitus            (if abnormal result is confirmed)   5.7-6.4%   Increased risk of developing Diabetes Mellitus   References:Diagnosis and Classification of Diabetes Mellitus,Diabetes ZTIW,5809,98(PJASN 1):S62-S69 and Standards of Medical Care in         Diabetes - 2011,Diabetes KNLZ,7673,41 (Suppl 1):S11-S61.     . Mean Plasma Glucose 04/14/2014 146* <117 mg/dL Final   Past Medical History  Diagnosis Date  . Bronchitis   . Diabetes mellitus   .  Hypercholesteremia   . Hypertension   . Morbid obesity 10/30/2012   Past Surgical History  Procedure Laterality Date  . Kidney stone removal    . Colonoscopy w/ polypectomy  2000  . Colonoscopy N/A 08/27/2013    Procedure:  COLONOSCOPY;  Surgeon: Daneil Dolin, MD;  Location: [facility];  Service: Endoscopy;  Laterality: N/A;  11:30 AM   Current Outpatient Prescriptions on File Prior to Visit  Medication Sig Dispense Refill  . beta carotene w/minerals (OCUVITE) tablet Take 1 tablet by mouth daily.    . Cyanocobalamin (VITAMIN B 12 PO) Take by mouth.    . furosemide (LASIX) 40 MG tablet Take 1 tablet (40 mg total) by mouth daily. 30 tablet 3  . losartan (COZAAR) 100 MG tablet Take 1 tablet (100 mg total) by mouth daily. 90 tablet 3  . metFORMIN (GLUCOPHAGE) 500 MG tablet TAKE ONE TABLET BY MOUTH TWICE DAILY 180 tablet 0  . potassium chloride SA (K-DUR,KLOR-CON) 20 MEQ tablet Take 1 tablet (20 mEq total) by mouth daily. 30 tablet 3  . saw palmetto 160 MG capsule Take 160 mg by mouth daily.     . simvastatin (ZOCOR) 40 MG tablet Take 1 tablet (40 mg total) by mouth at bedtime. 90 tablet 1   No current facility-administered medications on file prior to visit.   Allergies  Allergen Reactions  . Penicillins    History   Social History  . Marital Status: Married    Spouse Name: N/A    Number of Children: N/A  . Years of Education: N/A   Occupational History  . Not on file.   Social History Main Topics  . Smoking status: Current Some Day Smoker -- 5 years    Types: Cigars  . Smokeless tobacco: Not on file  . Alcohol Use: Yes     Comment: Occ wine  . Drug Use: No  . Sexual Activity: Not on file   Other Topics Concern  . Not on file   Social History Narrative   Family History  Problem Relation Age of Onset  . Heart Problems Mother       Review of Systems  All other systems reviewed and are negative.      Objective:   Physical Exam  Constitutional: He is oriented to person, place, and time. He appears well-developed and well-nourished. No distress.  HENT:  Head: Normocephalic and atraumatic.  Right Ear: External ear normal.  Left Ear: External ear normal.  Nose: Nose normal.    Mouth/Throat: Oropharynx is clear and moist. No oropharyngeal exudate.  Eyes: Conjunctivae and EOM are normal. Pupils are equal, round, and reactive to light. Right eye exhibits no discharge. Left eye exhibits no discharge. No scleral icterus.  Neck: Normal range of motion. Neck supple. No JVD present. No tracheal deviation present. No thyromegaly present.  Cardiovascular: Normal rate, regular rhythm, normal heart sounds and intact distal pulses.  Exam reveals no gallop and no friction rub.   No murmur heard. Pulmonary/Chest: Effort normal and breath sounds normal. No stridor. No respiratory distress. He has no wheezes. He has no rales. He exhibits no tenderness.  Abdominal: Soft. Bowel sounds are normal. He exhibits no distension. There is no tenderness. There is no rebound and no guarding.  Genitourinary: Rectum normal and prostate normal.  Musculoskeletal: He exhibits edema.  Lymphadenopathy:    He has no cervical adenopathy.  Neurological: He is alert and oriented to person, place, and time. He displays normal reflexes. No  cranial nerve deficit. He exhibits normal muscle tone. Coordination normal.  Skin: Skin is warm. No rash noted. He is not diaphoretic. No erythema. No pallor.  Psychiatric: He has a normal mood and affect. His behavior is normal. Judgment and thought content normal.  Vitals reviewed.         Assessment & Plan:  Lymphedema - Plan: Basic Metabolic Panel  Solitary pulmonary nodule - Plan: CT Chest W Contrast  I believe the patient has lymphedema in his left leg I would continue Lasix 40 mg by mouth daily and potassium chloride 3 mEq by mouth daily. I will recheck a be met today to evaluate for prerenal azotemia or potassium disturbances. If there are no electrolyte disturbances we will continue this plan and definitely to manage the patient's lymphedema. Also encouraged him to be compliant with compression stockings. Due to his smoking, I would recheck his chest CT in  6 months. I will schedule this now.

## 2014-05-09 ENCOUNTER — Encounter: Payer: Self-pay | Admitting: Family Medicine

## 2014-06-09 ENCOUNTER — Other Ambulatory Visit: Payer: Self-pay | Admitting: Family Medicine

## 2014-07-29 ENCOUNTER — Other Ambulatory Visit: Payer: 59

## 2014-07-29 DIAGNOSIS — Z119 Encounter for screening for infectious and parasitic diseases, unspecified: Secondary | ICD-10-CM

## 2014-07-29 DIAGNOSIS — Z139 Encounter for screening, unspecified: Secondary | ICD-10-CM

## 2014-08-01 LAB — MEASLES/MUMPS/RUBELLA IMMUNITY
Mumps IgG: 155 AU/mL — ABNORMAL HIGH (ref <9.00)
Rubella: 2.48 Index — ABNORMAL HIGH (ref <0.90)
Rubeola IgG: >300 AU/mL — ABNORMAL HIGH (ref <25.00)

## 2014-08-03 ENCOUNTER — Encounter: Payer: Self-pay | Admitting: Family Medicine

## 2014-08-03 ENCOUNTER — Telehealth: Payer: Self-pay | Admitting: Family Medicine

## 2014-08-03 NOTE — Telephone Encounter (Signed)
Patient calling for lab work results [phone]

## 2014-08-03 NOTE — Telephone Encounter (Signed)
Patient aware of results.

## 2014-08-11 ENCOUNTER — Telehealth: Payer: Self-pay | Admitting: Family Medicine

## 2014-08-11 ENCOUNTER — Other Ambulatory Visit: Payer: Self-pay | Admitting: Family Medicine

## 2014-08-11 DIAGNOSIS — I10 Essential (primary) hypertension: Secondary | ICD-10-CM

## 2014-08-11 MED ORDER — LOSARTAN POTASSIUM 100 MG PO TABS: 100.0000 mg | ORAL_TABLET | Freq: Every day | ORAL | Status: DC

## 2014-08-11 NOTE — Telephone Encounter (Signed)
Medication called/sent to requested pharmacy  

## 2014-08-11 NOTE — Telephone Encounter (Signed)
[phone] PT is needing a refill on his losartan (COZAAR) 100 MG tablet (wife states that the rx expired on 07/31/14 and that [facility] prob would not fill it without a new rx)  [facility] 

## 2014-08-17 ENCOUNTER — Encounter: Payer: Self-pay | Admitting: Family Medicine

## 2014-08-18 MED ORDER — POTASSIUM CHLORIDE CRYS ER 20 MEQ PO TBCR: 20.0000 meq | EXTENDED_RELEASE_TABLET | Freq: Every day | ORAL | Status: DC

## 2014-08-18 MED ORDER — FUROSEMIDE 40 MG PO TABS: 40.0000 mg | ORAL_TABLET | Freq: Every day | ORAL | Status: DC

## 2014-10-05 ENCOUNTER — Encounter: Payer: Self-pay | Admitting: Family Medicine

## 2014-10-05 DIAGNOSIS — I10 Essential (primary) hypertension: Secondary | ICD-10-CM

## 2014-10-06 MED ORDER — SIMVASTATIN 40 MG PO TABS: 40.0000 mg | ORAL_TABLET | Freq: Every day | ORAL | Status: DC

## 2014-10-06 MED ORDER — LOSARTAN POTASSIUM 100 MG PO TABS: 100.0000 mg | ORAL_TABLET | Freq: Every day | ORAL | Status: DC

## 2014-10-06 NOTE — Telephone Encounter (Signed)
Medication refilled per protocol. 

## 2014-10-07 ENCOUNTER — Encounter: Payer: Self-pay | Admitting: *Deleted

## 2014-10-11 ENCOUNTER — Other Ambulatory Visit: Payer: 59

## 2014-10-11 DIAGNOSIS — Z79899 Other long term (current) drug therapy: Secondary | ICD-10-CM

## 2014-10-11 DIAGNOSIS — E785 Hyperlipidemia, unspecified: Secondary | ICD-10-CM

## 2014-10-11 DIAGNOSIS — I10 Essential (primary) hypertension: Secondary | ICD-10-CM

## 2014-10-11 DIAGNOSIS — IMO0002 Reserved for concepts with insufficient information to code with codable children: Secondary | ICD-10-CM

## 2014-10-11 DIAGNOSIS — E1165 Type 2 diabetes mellitus with hyperglycemia: Secondary | ICD-10-CM

## 2014-10-11 DIAGNOSIS — E669 Obesity, unspecified: Secondary | ICD-10-CM

## 2014-10-11 LAB — COMPLETE METABOLIC PANEL WITH GFR
ALT: 29 U/L (ref 0–53)
AST: 22 U/L (ref 0–37)
Albumin: 3.9 g/dL (ref 3.5–5.2)
Alkaline Phosphatase: 57 U/L (ref 39–117)
BUN: 13 mg/dL (ref 6–23)
CO2: 24 mEq/L (ref 19–32)
Calcium: 8.7 mg/dL (ref 8.4–10.5)
Chloride: 105 mEq/L (ref 96–112)
Creat: 0.86 mg/dL (ref 0.50–1.35)
GFR, Est African American: >89 mL/min
GFR, Est Non African American: >89 mL/min
Glucose, Bld: 150 mg/dL — ABNORMAL HIGH (ref 70–99)
Potassium: 4.1 mEq/L (ref 3.5–5.3)
Sodium: 140 mEq/L (ref 135–145)
Total Bilirubin: 0.3 mg/dL (ref 0.2–1.2)
Total Protein: 6.4 g/dL (ref 6.0–8.3)

## 2014-10-11 LAB — CBC WITH DIFFERENTIAL/PLATELET
Basophils Absolute: 0.1 10*3/uL (ref 0.0–0.1)
Basophils Relative: 1 % (ref 0–1)
Eosinophils Absolute: 0.2 10*3/uL (ref 0.0–0.7)
Eosinophils Relative: 3 % (ref 0–5)
HCT: 43.2 % (ref 39.0–52.0)
Hemoglobin: 14.4 g/dL (ref 13.0–17.0)
Lymphocytes Relative: 29 % (ref 12–46)
Lymphs Abs: 1.7 10*3/uL (ref 0.7–4.0)
MCH: 31.8 pg (ref 26.0–34.0)
MCHC: 33.3 g/dL (ref 30.0–36.0)
MCV: 95.4 fL (ref 78.0–100.0)
MPV: 10.6 fL (ref 8.6–12.4)
Monocytes Absolute: 0.7 10*3/uL (ref 0.1–1.0)
Monocytes Relative: 12 % (ref 3–12)
Neutro Abs: 3.3 10*3/uL (ref 1.7–7.7)
Neutrophils Relative %: 55 % (ref 43–77)
Platelets: 247 10*3/uL (ref 150–400)
RBC: 4.53 MIL/uL (ref 4.22–5.81)
RDW: 14.4 % (ref 11.5–15.5)
WBC: 6 10*3/uL (ref 4.0–10.5)

## 2014-10-11 LAB — LIPID PANEL
Cholesterol: 113 mg/dL (ref 0–200)
HDL: 31 mg/dL — ABNORMAL LOW (ref >=40)
LDL Cholesterol: 37 mg/dL (ref 0–99)
Total CHOL/HDL Ratio: 3.6 Ratio
Triglycerides: 226 mg/dL — ABNORMAL HIGH (ref <150)
VLDL: 45 mg/dL — ABNORMAL HIGH (ref 0–40)

## 2014-10-11 LAB — HEMOGLOBIN A1C
Hgb A1c MFr Bld: 6.9 % — ABNORMAL HIGH (ref <5.7)
Mean Plasma Glucose: 151 mg/dL — ABNORMAL HIGH (ref <117)

## 2014-10-13 ENCOUNTER — Ambulatory Visit (HOSPITAL_COMMUNITY)
Admission: RE | Admit: 2014-10-13 | Discharge: 2014-10-13 | Disposition: A | Discharge status: Home / Self Care | Payer: 59 | Source: Ambulatory Visit | Attending: Family Medicine | Admitting: Family Medicine

## 2014-10-13 DIAGNOSIS — R911 Solitary pulmonary nodule: Secondary | ICD-10-CM | POA: Diagnosis not present

## 2014-10-13 MED ORDER — IOHEXOL 300 MG/ML  SOLN
75.0000 mL | Freq: Once | INTRAMUSCULAR | Status: AC | PRN
  Administered 2014-10-13: 75 mL via INTRAVENOUS

## 2014-10-14 ENCOUNTER — Encounter: Payer: Self-pay | Admitting: Family Medicine

## 2015-01-30 ENCOUNTER — Encounter: Payer: Self-pay | Admitting: Family Medicine

## 2015-03-08 ENCOUNTER — Other Ambulatory Visit: Payer: Self-pay | Admitting: Family Medicine

## 2015-03-29 ENCOUNTER — Other Ambulatory Visit: Payer: Self-pay | Admitting: Family Medicine

## 2015-04-05 ENCOUNTER — Ambulatory Visit (INDEPENDENT_AMBULATORY_CARE_PROVIDER_SITE_OTHER): Payer: 59 | Admitting: Physician Assistant

## 2015-04-05 ENCOUNTER — Encounter: Payer: Self-pay | Admitting: Physician Assistant

## 2015-04-05 VITALS — BP 114/70 | HR 84 | Temp 98.7°F | Resp 18 | Wt 348.0 lb

## 2015-04-05 DIAGNOSIS — L03116 Cellulitis of left lower limb: Secondary | ICD-10-CM

## 2015-04-05 DIAGNOSIS — B9689 Other specified bacterial agents as the cause of diseases classified elsewhere: Principal | ICD-10-CM

## 2015-04-05 DIAGNOSIS — J988 Other specified respiratory disorders: Secondary | ICD-10-CM

## 2015-04-05 MED ORDER — AZITHROMYCIN 250 MG PO TABS: ORAL_TABLET | ORAL | Status: DC

## 2015-04-05 MED ORDER — DOXYCYCLINE HYCLATE 100 MG PO TABS: 100.0000 mg | ORAL_TABLET | Freq: Two times a day (BID) | ORAL | Status: DC

## 2015-04-05 NOTE — Progress Notes (Signed)
Patient ID: Eric Johnson MRN: [id number], DOB: [birth year], 55 y.o. Date of Encounter: @DATE @  Chief Complaint:  Chief Complaint  Patient presents with  . sick x 1 week    head/chest congestion    HPI: 55 y.o. year old white male  presents with above.   Says that symptoms started the December 24th. He is having both head/nasal congestion and mucus from the nose as well as chest congestion and cough. No significant sore throat. No known fevers or chills. No wheezing.  Says says that his left anterior shin has been red for the past several days. Says it was actually worse a few days ago and looks a little better now. Is concerned because he has diabetes and has never had this kind of problem before.   Past Medical History  Diagnosis Date  . Bronchitis   . Diabetes mellitus   . Hypercholesteremia   . Hypertension   . Morbid obesity ([place]) 10/30/2012     Home Meds: Outpatient Prescriptions Prior to Visit  Medication Sig Dispense Refill  . beta carotene w/minerals (OCUVITE) tablet Take 1 tablet by mouth daily.    . Cyanocobalamin (VITAMIN B 12 PO) Take by mouth.    . furosemide (LASIX) 40 MG tablet Take 1 tablet (40 mg total) by mouth daily. 90 tablet 3  . losartan (COZAAR) 100 MG tablet Take 1 tablet (100 mg total) by mouth daily. 90 tablet 1  . metFORMIN (GLUCOPHAGE) 500 MG tablet TAKE ONE TABLET BY MOUTH TWICE DAILY 180 tablet 0  . potassium chloride SA (K-DUR,KLOR-CON) 20 MEQ tablet Take 1 tablet (20 mEq total) by mouth daily. 90 tablet 3  . saw palmetto 160 MG capsule Take 160 mg by mouth daily.     . simvastatin (ZOCOR) 40 MG tablet Take 1 tablet (40 mg total) by mouth daily with breakfast. Needs office visit and labs before further refills 90 tablet 0   No facility-administered medications prior to visit.    Allergies:  Allergies  Allergen Reactions  . Penicillins     Social History   Social History  . Marital Status: Married    Spouse Name: N/A  . Number of  Children: N/A  . Years of Education: N/A   Occupational History  . Not on file.   Social History Main Topics  . Smoking status: Current Some Day Smoker -- 5 years    Types: Cigars  . Smokeless tobacco: Not on file  . Alcohol Use: Yes     Comment: Occ wine  . Drug Use: No  . Sexual Activity: Not on file   Other Topics Concern  . Not on file   Social History Narrative    Family History  Problem Relation Age of Onset  . Heart Problems Mother      Review of Systems:  See HPI for pertinent ROS. All other ROS negative.    Physical Exam: Blood pressure 114/70, pulse 84, temperature 98.7 F (37.1 C), temperature source Oral, resp. rate 18, weight 348 lb (157.852 kg)., Body mass index is 48.56 kg/(m^2). General: Severely obese white male . Appears in no acute distress. Head: Normocephalic, atraumatic, eyes without discharge, sclera non-icteric, nares are without discharge. Bilateral auditory canals clear, TM's are without perforation, pearly grey and translucent with reflective cone of light bilaterally. Oral cavity moist, posterior pharynx without exudate, erythema, peritonsillar abscess. No tenderness with percussion to frontal or maxillary sinuses bilaterally.  Neck: Supple. No thyromegaly. No lymphadenopathy. Lungs: Clear bilaterally to  auscultation without wheezes, rales, or rhonchi. Breathing is unlabored. Heart: RRR with S1 S2. No murmurs, rubs, or gallops. Musculoskeletal:  Strength and tone normal for age. Extremities/Skin: Left Anterior Shin: Measuring vertically up and down his anterior shin it is an approximate 8 inch area that is diffuse light pink erythema. This wraps around to both sides of his calf. Neuro: Alert and oriented X 3. Moves all extremities spontaneously. Gait is normal. CNII-XII grossly in tact. Psych:  Responds to questions appropriately with a normal affect.     ASSESSMENT AND PLAN:  55 y.o. year old male with  1. Bacterial respiratory infection -  doxycycline (VIBRA-TABS) 100 MG tablet; Take 1 tablet (100 mg total) by mouth 2 (two) times daily.  Dispense: 20 tablet; Refill: 0  2. Cellulitis of left lower extremity - doxycycline (VIBRA-TABS) 100 MG tablet; Take 1 tablet (100 mg total) by mouth 2 (two) times daily.  Dispense: 20 tablet; Refill: 0  Will use doxycycline to treat both the bacterial respiratory infection and cellulitis of his leg. Told him that if either the respiratory symptoms do not resolve upon completion of antibiotic--- or if color of the skin on the left anterior shin does not return to normal upon completion of antibiotic, then he needs to schedule follow-up office visit to further evaluate and treat. He voices understanding and agrees.  Signed, [address] [place], [place], [facility] 57/07/2015 10:31 AM

## 2015-04-06 ENCOUNTER — Ambulatory Visit: Payer: 59 | Admitting: Family Medicine

## 2015-06-07 ENCOUNTER — Encounter: Payer: Self-pay | Admitting: Family Medicine

## 2015-06-08 MED ORDER — METFORMIN HCL 500 MG PO TABS: 500.0000 mg | ORAL_TABLET | Freq: Two times a day (BID) | ORAL | Status: DC

## 2015-06-08 NOTE — Telephone Encounter (Signed)
Medication called/sent to requested pharmacy  

## 2015-06-13 ENCOUNTER — Ambulatory Visit (INDEPENDENT_AMBULATORY_CARE_PROVIDER_SITE_OTHER): Payer: 59 | Admitting: Family Medicine

## 2015-06-13 ENCOUNTER — Encounter: Payer: Self-pay | Admitting: Family Medicine

## 2015-06-13 VITALS — BP 122/78 | HR 94 | Temp 98.4°F | Resp 18 | Ht 71.0 in | Wt 349.0 lb

## 2015-06-13 DIAGNOSIS — Z0000 Encounter for general adult medical examination without abnormal findings: Secondary | ICD-10-CM

## 2015-06-13 DIAGNOSIS — E119 Type 2 diabetes mellitus without complications: Secondary | ICD-10-CM | POA: Diagnosis not present

## 2015-06-13 DIAGNOSIS — I10 Essential (primary) hypertension: Secondary | ICD-10-CM | POA: Diagnosis not present

## 2015-06-13 DIAGNOSIS — Z125 Encounter for screening for malignant neoplasm of prostate: Secondary | ICD-10-CM | POA: Diagnosis not present

## 2015-06-13 DIAGNOSIS — Z23 Encounter for immunization: Secondary | ICD-10-CM

## 2015-06-13 LAB — CBC WITH DIFFERENTIAL/PLATELET
Basophils Absolute: 0 10*3/uL (ref 0.0–0.1)
Basophils Relative: 0 % (ref 0–1)
Eosinophils Absolute: 0.1 10*3/uL (ref 0.0–0.7)
Eosinophils Relative: 2 % (ref 0–5)
HCT: 44 % (ref 39.0–52.0)
Hemoglobin: 15.5 g/dL (ref 13.0–17.0)
Lymphocytes Relative: 23 % (ref 12–46)
Lymphs Abs: 1.4 10*3/uL (ref 0.7–4.0)
MCH: 33.3 pg (ref 26.0–34.0)
MCHC: 35.2 g/dL (ref 30.0–36.0)
MCV: 94.6 fL (ref 78.0–100.0)
MPV: 10.2 fL (ref 8.6–12.4)
Monocytes Absolute: 0.7 10*3/uL (ref 0.1–1.0)
Monocytes Relative: 12 % (ref 3–12)
Neutro Abs: 3.8 10*3/uL (ref 1.7–7.7)
Neutrophils Relative %: 63 % (ref 43–77)
Platelets: 230 10*3/uL (ref 150–400)
RBC: 4.65 MIL/uL (ref 4.22–5.81)
RDW: 13.9 % (ref 11.5–15.5)
WBC: 6.1 10*3/uL (ref 4.0–10.5)

## 2015-06-13 LAB — LIPID PANEL
Cholesterol: 120 mg/dL — ABNORMAL LOW (ref 125–200)
HDL: 32 mg/dL — ABNORMAL LOW (ref >=40)
LDL Cholesterol: 56 mg/dL (ref <130)
Total CHOL/HDL Ratio: 3.8 Ratio (ref <=5.0)
Triglycerides: 161 mg/dL — ABNORMAL HIGH (ref <150)
VLDL: 32 mg/dL — ABNORMAL HIGH (ref <30)

## 2015-06-13 LAB — COMPLETE METABOLIC PANEL WITH GFR
ALT: 25 U/L (ref 9–46)
AST: 19 U/L (ref 10–35)
Albumin: 4 g/dL (ref 3.6–5.1)
Alkaline Phosphatase: 67 U/L (ref 40–115)
BUN: 15 mg/dL (ref 7–25)
CO2: 26 mmol/L (ref 20–31)
Calcium: 9.3 mg/dL (ref 8.6–10.3)
Chloride: 104 mmol/L (ref 98–110)
Creat: 0.88 mg/dL (ref 0.70–1.33)
GFR, Est African American: >89 mL/min (ref >=60)
GFR, Est Non African American: >89 mL/min (ref >=60)
Glucose, Bld: 161 mg/dL — ABNORMAL HIGH (ref 70–99)
Potassium: 4.2 mmol/L (ref 3.5–5.3)
Sodium: 139 mmol/L (ref 135–146)
Total Bilirubin: 0.3 mg/dL (ref 0.2–1.2)
Total Protein: 6.9 g/dL (ref 6.1–8.1)

## 2015-06-13 NOTE — Progress Notes (Signed)
Subjective:    Patient ID: Eric Johnson, male    DOB: [birth year], 55 y.o.   MRN: [id number]  HPI Patient is here today for complete physical exam. Colonoscopy was performed in 2015 and is up-to-date. As a diabetic he is due for Pneumovax 23. He is also due for an eye exam as well as a diabetic foot exam. Past medical history is significant for morbid obesity. He is not exercising at all. His schedule is very busy as he is going back to school to get a bachelor's degree. Unfortunately he is not following any diet. He also snores. I've asked him if he is ever been screened for sleep apnea and he states no but he is not interested in screening at the present time. He is compliant taking an aspirin. He denies any chest pain shortness of breath or dyspnea on exertion. He denies any orthopnea. He denies any neuropathy. He received his flu shot at work. Past Medical History  Diagnosis Date  . Bronchitis   . Diabetes mellitus   . Hypercholesteremia   . Hypertension   . Morbid obesity ([place]) 10/30/2012   Past Surgical History  Procedure Laterality Date  . Kidney stone removal    . Colonoscopy w/ polypectomy  2000  . Colonoscopy N/A 08/27/2013    Procedure: COLONOSCOPY;  Surgeon: Daneil Dolin, MD;  [facility];  Service: Endoscopy;  Laterality: N/A;  11:30 AM   Current Outpatient Prescriptions on File Prior to Visit  Medication Sig Dispense Refill  . beta carotene w/minerals (OCUVITE) tablet Take 1 tablet by mouth daily.    . Cyanocobalamin (VITAMIN B 12 PO) Take by mouth.    . furosemide (LASIX) 40 MG tablet Take 1 tablet (40 mg total) by mouth daily. 90 tablet 3  . losartan (COZAAR) 100 MG tablet Take 1 tablet (100 mg total) by mouth daily. 90 tablet 1  . metFORMIN (GLUCOPHAGE) 500 MG tablet Take 1 tablet (500 mg total) by mouth 2 (two) times daily. 180 tablet 0  . potassium chloride SA (K-DUR,KLOR-CON) 20 MEQ tablet Take 1 tablet (20 mEq total) by mouth daily. 90 tablet 3  . saw  palmetto 160 MG capsule Take 160 mg by mouth daily.     . simvastatin (ZOCOR) 40 MG tablet Take 1 tablet (40 mg total) by mouth daily with breakfast. Needs office visit and labs before further refills 90 tablet 0   No current facility-administered medications on file prior to visit.   Allergies  Allergen Reactions  . Penicillins    Social History   Social History  . Marital Status: Married    Spouse Name: N/A  . Number of Children: N/A  . Years of Education: N/A   Occupational History  . Not on file.   Social History Main Topics  . Smoking status: Current Some Day Smoker -- 5 years    Types: Cigars  . Smokeless tobacco: Former Systems developer    Types: Snuff  . Alcohol Use: 0.0 oz/week    0 Standard drinks or equivalent per week     Comment: Occ wine  . Drug Use: No  . Sexual Activity: Not on file   Other Topics Concern  . Not on file   Social History Narrative   Family History  Problem Relation Age of Onset  . Heart Problems Mother       Review of Systems  All other systems reviewed and are negative.      Objective:  Physical Exam  Constitutional: He is oriented to person, place, and time. He appears well-developed and well-nourished. No distress.  HENT:  Head: Normocephalic and atraumatic.  Right Ear: External ear normal.  Left Ear: External ear normal.  Nose: Nose normal.  Mouth/Throat: Oropharynx is clear and moist. No oropharyngeal exudate.  Eyes: Conjunctivae and EOM are normal. Pupils are equal, round, and reactive to light. Right eye exhibits no discharge. Left eye exhibits no discharge. No scleral icterus.  Neck: Normal range of motion. Neck supple. No JVD present. No tracheal deviation present. No thyromegaly present.  Cardiovascular: Normal rate, regular rhythm, normal heart sounds and intact distal pulses.  Exam reveals no gallop and no friction rub.   No murmur heard. Pulmonary/Chest: Effort normal and breath sounds normal. No stridor. No respiratory  distress. He has no wheezes. He has no rales. He exhibits no tenderness.  Abdominal: Soft. Bowel sounds are normal. He exhibits no distension and no mass. There is no tenderness. There is no rebound and no guarding.  Musculoskeletal: Normal range of motion. He exhibits no edema or tenderness.  Lymphadenopathy:    He has no cervical adenopathy.  Neurological: He is alert and oriented to person, place, and time. He has normal reflexes. He displays normal reflexes. No cranial nerve deficit. He exhibits normal muscle tone. Coordination normal.  Skin: Skin is warm. No rash noted. He is not diaphoretic. No erythema. No pallor.  Psychiatric: He has a normal mood and affect. His behavior is normal. Judgment and thought content normal.  Vitals reviewed.         Assessment & Plan:  Controlled type 2 diabetes mellitus without complication, without long-term current use of insulin ([place]) - Plan: CBC with Differential/Platelet, COMPLETE METABOLIC PANEL WITH GFR, Hemoglobin A1c, Lipid panel, Microalbumin, urine  Prostate cancer screening - Plan: PSA  Essential hypertension  Routine general medical examination at a health care facility   Physical exam significant for morbid obesity, venous stasis dermatitis. He also has poor dentition. I have recommended that he see an ophthalmologist for an annual diabetic eye exam. Offer the patient hepatitis C screening along with HIV screening but he politely declined. He will schedule appointment with his ophthalmologist. I strongly recommended diet exercise and weight loss. Also recommended that he schedule appointment with the dentist to reduce his cardiovascular risk from peridontal disease. I will check a CBC, CMP, fasting lipid panel, hemoglobin A1c, urine microalbumin, and a PSA. His colonoscopy is up-to-date.

## 2015-06-14 LAB — PSA: PSA: 0.83 ng/mL (ref <=4.00)

## 2015-06-14 LAB — HEMOGLOBIN A1C
Hgb A1c MFr Bld: 7.1 % — ABNORMAL HIGH (ref <5.7)
Mean Plasma Glucose: 157 mg/dL — ABNORMAL HIGH (ref <117)

## 2015-06-14 LAB — MICROALBUMIN, URINE: Microalb, Ur: 0.4 mg/dL

## 2015-06-15 ENCOUNTER — Encounter: Payer: Self-pay | Admitting: Family Medicine

## 2015-06-29 ENCOUNTER — Other Ambulatory Visit: Payer: Self-pay | Admitting: Family Medicine

## 2015-07-19 ENCOUNTER — Other Ambulatory Visit: Payer: Self-pay | Admitting: Family Medicine

## 2015-07-20 NOTE — Telephone Encounter (Signed)
Refill appropriate and filled per protocol. 

## 2015-07-24 ENCOUNTER — Encounter: Payer: Self-pay | Admitting: Family Medicine

## 2015-07-24 MED ORDER — POTASSIUM CHLORIDE CRYS ER 20 MEQ PO TBCR: 20.0000 meq | EXTENDED_RELEASE_TABLET | Freq: Every day | ORAL | Status: DC

## 2015-07-24 NOTE — Telephone Encounter (Signed)
Medication called/sent to requested pharmacy  

## 2015-08-09 ENCOUNTER — Other Ambulatory Visit: Payer: Self-pay | Admitting: Family Medicine

## 2015-08-10 NOTE — Telephone Encounter (Signed)
Refill appropriate and filled per protocol. 

## 2015-08-29 ENCOUNTER — Encounter: Payer: Self-pay | Admitting: Family Medicine

## 2015-08-29 ENCOUNTER — Ambulatory Visit (INDEPENDENT_AMBULATORY_CARE_PROVIDER_SITE_OTHER): Payer: 59 | Admitting: Family Medicine

## 2015-08-29 VITALS — BP 128/78 | HR 88 | Temp 98.8°F | Resp 20 | Ht 71.0 in | Wt 341.0 lb

## 2015-08-29 DIAGNOSIS — J45909 Unspecified asthma, uncomplicated: Secondary | ICD-10-CM | POA: Diagnosis not present

## 2015-08-29 MED ORDER — PREDNISONE 20 MG PO TABS: ORAL_TABLET | ORAL | Status: DC

## 2015-08-29 MED ORDER — ALBUTEROL SULFATE HFA 108 (90 BASE) MCG/ACT IN AERS: 2.0000 | INHALATION_SPRAY | Freq: Four times a day (QID) | RESPIRATORY_TRACT | Status: DC | PRN

## 2015-08-29 NOTE — Progress Notes (Signed)
Subjective:    Patient ID: Eric Johnson, male    DOB: Sep 19, 1960, 55 y.o.   MRN: [id number]  HPI Patient's wife was recently diagnosed with asthmatic bronchitis. She was given prednisone and albuterol. She took those medications and her symptoms improved. I also gave her a Z-Pak in case she develops a high fever or purulent sputum. She never had to fill that prescription. Her husband developed similar symptoms last week. He went ahead and took the Z-Pak. His symptoms have not improved. In fact he is more short of breath. He is audibly wheezing. He reports chest congestion that he is unable to bring up. He denies any fevers or chills or purulent sputum. On examination today he has diminished breath sounds bilaterally with expiratory wheezes and rales.   Past Medical History  Diagnosis Date  . Bronchitis   . Diabetes mellitus   . Hypercholesteremia   . Hypertension   . Morbid obesity ([place]) 10/30/2012   Past Surgical History  Procedure Laterality Date  . Kidney stone removal    . Colonoscopy w/ polypectomy  2000  . Colonoscopy N/A 08/27/2013    Procedure: COLONOSCOPY;  Surgeon: Daneil Dolin, MD;  [facility];  Service: Endoscopy;  Laterality: N/A;  11:30 AM   Current Outpatient Prescriptions on File Prior to Visit  Medication Sig Dispense Refill  . beta carotene w/minerals (OCUVITE) tablet Take 1 tablet by mouth daily.    . Cyanocobalamin (VITAMIN B 12 PO) Take by mouth.    . furosemide (LASIX) 40 MG tablet TAKE ONE TABLET BY MOUTH ONCE DAILY 90 tablet 0  . losartan (COZAAR) 100 MG tablet Take 1 tablet (100 mg total) by mouth daily. 90 tablet 1  . metFORMIN (GLUCOPHAGE) 500 MG tablet TAKE ONE TABLET BY MOUTH TWICE DAILY 180 tablet 0  . potassium chloride SA (K-DUR,KLOR-CON) 20 MEQ tablet Take 1 tablet (20 mEq total) by mouth daily. 90 tablet 3  . saw palmetto 160 MG capsule Take 160 mg by mouth daily.     . simvastatin (ZOCOR) 40 MG tablet Take 1 tablet (40 mg total) by  mouth at bedtime. 90 tablet 1   No current facility-administered medications on file prior to visit.   Allergies  Allergen Reactions  . Penicillins    Social History   Social History  . Marital Status: Married    Spouse Name: N/A  . Number of Children: N/A  . Years of Education: N/A   Occupational History  . Not on file.   Social History Main Topics  . Smoking status: Current Some Day Smoker -- 5 years    Types: Cigars  . Smokeless tobacco: Former Systems developer    Types: Snuff  . Alcohol Use: 0.0 oz/week    0 Standard drinks or equivalent per week     Comment: Occ wine  . Drug Use: No  . Sexual Activity: Not on file   Other Topics Concern  . Not on file   Social History Narrative      Review of Systems  All other systems reviewed and are negative.      Objective:   Physical Exam  HENT:  Right Ear: External ear normal.  Left Ear: External ear normal.  Nose: Nose normal.  Mouth/Throat: Oropharynx is clear and moist. No oropharyngeal exudate.  Neck: Neck supple.  Cardiovascular: Normal rate, regular rhythm and normal heart sounds.   Pulmonary/Chest: Effort normal. He has wheezes. He has rales.  Vitals reviewed.  Assessment & Plan:  Mild reactive airways disease - Plan: predniSONE (DELTASONE) 20 MG tablet, albuterol (PROVENTIL HFA;VENTOLIN HFA) 108 (90 Base) MCG/ACT inhaler  I explained to the patient a believe he has viral bronchitis and asthma antibiotic is not helping. I see no evidence of pneumonia. He does have a lot of chest congestion. I recommended Mucinex as an expectorant. However I will treat his reactive airway disease/bronchitis with a prednisone taper pack and albuterol 2 puffs inhaled every 6 hours as needed. Recheck in 48 hours if no better or sooner if worse

## 2015-09-03 ENCOUNTER — Ambulatory Visit (HOSPITAL_COMMUNITY)
Admission: EM | Admit: 2015-09-03 | Discharge: 2015-09-03 | Disposition: A | Discharge status: Home / Self Care | Payer: 59 | Attending: Family Medicine | Admitting: Family Medicine

## 2015-09-03 DIAGNOSIS — J4540 Moderate persistent asthma, uncomplicated: Secondary | ICD-10-CM | POA: Diagnosis not present

## 2015-09-03 MED ORDER — PREDNISONE 20 MG PO TABS: ORAL_TABLET | ORAL | Status: DC

## 2015-09-03 MED ORDER — ALBUTEROL SULFATE HFA 108 (90 BASE) MCG/ACT IN AERS
INHALATION_SPRAY | RESPIRATORY_TRACT | Status: AC
  Filled 2015-09-03: qty 6.7

## 2015-09-03 MED ORDER — IPRATROPIUM-ALBUTEROL 0.5-2.5 (3) MG/3ML IN SOLN
RESPIRATORY_TRACT | Status: AC
  Filled 2015-09-03: qty 3

## 2015-09-03 MED ORDER — TRIAMCINOLONE ACETONIDE 40 MG/ML IJ SUSP
INTRAMUSCULAR | Status: AC
  Filled 2015-09-03: qty 1

## 2015-09-03 MED ORDER — TRIAMCINOLONE ACETONIDE 40 MG/ML IJ SUSP
40.0000 mg | Freq: Once | INTRAMUSCULAR | Status: AC
  Administered 2015-09-03: 40 mg via INTRAMUSCULAR

## 2015-09-03 MED ORDER — IPRATROPIUM-ALBUTEROL 0.5-2.5 (3) MG/3ML IN SOLN
3.0000 mL | Freq: Once | RESPIRATORY_TRACT | Status: AC
  Administered 2015-09-03: 3 mL via RESPIRATORY_TRACT

## 2015-09-03 MED ORDER — ALBUTEROL SULFATE (2.5 MG/3ML) 0.083% IN NEBU: 2.5000 mg | INHALATION_SOLUTION | Freq: Once | RESPIRATORY_TRACT | Status: DC

## 2015-09-03 MED ORDER — ALBUTEROL SULFATE (2.5 MG/3ML) 0.083% IN NEBU
2.5000 mg | INHALATION_SOLUTION | Freq: Once | RESPIRATORY_TRACT | Status: AC
  Administered 2015-09-03: 2.5 mg via RESPIRATORY_TRACT

## 2015-09-03 MED ORDER — BENZONATATE 100 MG PO CAPS: 100.0000 mg | ORAL_CAPSULE | Freq: Three times a day (TID) | ORAL | Status: DC

## 2015-09-03 MED ORDER — ALBUTEROL SULFATE (2.5 MG/3ML) 0.083% IN NEBU
INHALATION_SOLUTION | RESPIRATORY_TRACT | Status: AC
  Filled 2015-09-03: qty 3

## 2015-09-03 NOTE — ED Notes (Signed)
Pt stated that he has bronchitis and the medications have not been helping that he receive from his PCP Pt stated that his chest hurts from coughing so much Pt has been taking OTC cough syrups

## 2015-09-03 NOTE — Discharge Instructions (Signed)
How to Use an Inhaler  Using your inhaler correctly is very important. Good technique will make sure that the medicine reaches your lungs.   HOW TO USE AN INHALER:  1. Take the cap off the inhaler.  2. If this is the first time using your inhaler, you need to prime it. Shake the inhaler for 5 seconds. Release four puffs into the air, away from your face. Ask your doctor for help if you have questions.  3. Shake the inhaler for 5 seconds.  4. Turn the inhaler so the bottle is above the mouthpiece.  5. Put your pointer finger on top of the bottle. Your thumb holds the bottom of the inhaler.  6. Open your mouth.  7. Either hold the inhaler away from your mouth (the width of 2 fingers) or place your lips tightly around the mouthpiece. Ask your doctor which way to use your inhaler.  8. Breathe out as much air as possible.  9. Breathe in and push down on the bottle 1 time to release the medicine. You will feel the medicine go in your mouth and throat.  10. Continue to take a deep breath in very slowly. Try to fill your lungs.  11. After you have breathed in completely, hold your breath for 10 seconds. This will help the medicine to settle in your lungs. If you cannot hold your breath for 10 seconds, hold it for as long as you can before you breathe out.  12. Breathe out slowly, through pursed lips. Whistling is an example of pursed lips.  13. If your doctor has told you to take more than 1 puff, wait at least 15-30 seconds between puffs. This will help you get the best results from your medicine. Do not use the inhaler more than your doctor tells you to.  14. Put the cap back on the inhaler.  15. Follow the directions from your doctor or from the inhaler package about cleaning the inhaler.  If you use more than one inhaler, ask your doctor which inhalers to use and what order to use them in. Ask your doctor to help you figure out when you will need to refill your inhaler.   If you use a steroid inhaler, always rinse your  mouth with water after your last puff, gargle and spit out the water. Do not swallow the water.  GET HELP IF:  · The inhaler medicine only partially helps to stop wheezing or shortness of breath.  · You are having trouble using your inhaler.  · You have some increase in thick spit (phlegm).  GET HELP RIGHT AWAY IF:  · The inhaler medicine does not help your wheezing or shortness of breath or you have tightness in your chest.  · You have dizziness, headaches, or fast heart rate.  · You have chills, fever, or night sweats.  · You have a large increase of thick spit, or your thick spit is bloody.  MAKE SURE YOU:   · Understand these instructions.  · Will watch your condition.  · Will get help right away if you are not doing well or get worse.     This information is not intended to replace advice given to you by your health care provider. Make sure you discuss any questions you have with your health care provider.     Document Released: 12/26/2007 Document Revised: 01/06/2013 Document Reviewed: 10/15/2012  [employer] Interactive Patient Education ©2016 [employer]

## 2015-09-03 NOTE — ED Provider Notes (Signed)
CSN: [id number]     Arrival date & time 09/03/15  1832 History   First MD Initiated Contact with Patient 09/03/15 1852     Chief Complaint  Patient presents with  . Cough   (Consider location/radiation/quality/duration/timing/severity/associated sxs/prior Treatment) HPI Comments: 55 year old morbidly obese male is complaining of cough and dyspnea. He saw his PCP little over week ago was told he had asthmatic bronchitis and treated with prednisone and albuterol HFA. He continues to have cough. Sx's began about 2 weeks ago.  No fevers. Has completed the Prednisone and using Alb HFA q 6h without relief.  Dyspnea rest and activity. Smokes cigars.   Past Medical History  Diagnosis Date  . Bronchitis   . Diabetes mellitus   . Hypercholesteremia   . Hypertension   . Morbid obesity ([place]) 10/30/2012   Past Surgical History  Procedure Laterality Date  . Kidney stone removal    . Colonoscopy w/ polypectomy  2000  . Colonoscopy N/A 08/27/2013    Procedure: COLONOSCOPY;  Surgeon: Daneil Dolin, MD;  Location: [facility];  Service: Endoscopy;  Laterality: N/A;  11:30 AM   Family History  Problem Relation Age of Onset  . Heart Problems Mother    Social History  Substance Use Topics  . Smoking status: Current Some Day Smoker -- 5 years    Types: Cigars  . Smokeless tobacco: Former Systems developer    Types: Snuff  . Alcohol Use: 0.0 oz/week    0 Standard drinks or equivalent per week     Comment: Occ wine    Review of Systems  Constitutional: Negative.   HENT: Negative.   Respiratory: Positive for cough and shortness of breath.   Cardiovascular: Negative.   Neurological: Negative.   All other systems reviewed and are negative.   Allergies  Penicillins  Home Medications   Prior to Admission medications   Medication Sig Start Date End Date Taking? Authorizing Provider  furosemide (LASIX) 40 MG tablet TAKE ONE TABLET BY MOUTH ONCE DAILY 08/10/15  Yes [name], MD  losartan  (COZAAR) 100 MG tablet Take 1 tablet (100 mg total) by mouth daily. 10/06/14  Yes [name], MD  metFORMIN (GLUCOPHAGE) 500 MG tablet TAKE ONE TABLET BY MOUTH TWICE DAILY 07/20/15  Yes [name], MD  potassium chloride SA (K-DUR,KLOR-CON) 20 MEQ tablet Take 1 tablet (20 mEq total) by mouth daily. 07/24/15  Yes [name], MD  albuterol (PROVENTIL HFA;VENTOLIN HFA) 108 (90 Base) MCG/ACT inhaler Inhale 2 puffs into the lungs every 6 (six) hours as needed for wheezing or shortness of breath. 08/29/15   [name], MD  benzonatate (TESSALON) 100 MG capsule Take 1 capsule (100 mg total) by mouth every 8 (eight) hours. 09/03/15   Janne Napoleon, NP  beta carotene w/minerals (OCUVITE) tablet Take 1 tablet by mouth daily.    Historical Provider, MD  Cyanocobalamin (VITAMIN B 12 PO) Take by mouth.    Historical Provider, MD  predniSONE (DELTASONE) 20 MG tablet Take 3 tabs po on first day, 2 tabs second day, 2 tabs third day, 1 tab fourth day, 1 tab 5th day. Take with food. 09/03/15   Janne Napoleon, NP  saw palmetto 160 MG capsule Take 160 mg by mouth daily.     Historical Provider, MD  simvastatin (ZOCOR) 40 MG tablet Take 1 tablet (40 mg total) by mouth at bedtime. 06/29/15   [name], MD   Meds Ordered and Administered this Visit  Medications  albuterol (PROVENTIL) (2.5 MG/3ML) 0.083% nebulizer solution 2.5 mg (not administered)  ipratropium-albuterol (DUONEB) 0.5-2.5 (3) MG/3ML nebulizer solution 3 mL (3 mLs Nebulization Given 09/03/15 1913)  albuterol (PROVENTIL) (2.5 MG/3ML) 0.083% nebulizer solution 2.5 mg (2.5 mg Nebulization Given 09/03/15 1913)  triamcinolone acetonide (KENALOG-40) injection 40 mg (40 mg Intramuscular Given 09/03/15 1913)    BP 119/80 mmHg  Pulse 91  Temp(Src) 99.3 F (37.4 C) (Oral)  SpO2 90% No data found.   Physical Exam  Constitutional: He is oriented to person, place, and time. He appears well-developed and well-nourished. No distress.  HENT:  Right  Ear: External ear normal.  Left Ear: External ear normal.  Mouth/Throat: Oropharynx is clear and moist. No oropharyngeal exudate.  Bilateral TMs are normal  Eyes: Conjunctivae and EOM are normal.  Neck: Normal range of motion. Neck supple.  Cardiovascular: Normal rate and regular rhythm.   Pulmonary/Chest:  Loud, diffuse, bilateral wheezes and coarseness inspiratory and expiratory. Prolonged expiratory phase  Musculoskeletal: Normal range of motion.  Lymphadenopathy:    He has no cervical adenopathy.  Neurological: He is alert and oriented to person, place, and time.  Skin: Skin is warm and dry.  Psychiatric: He has a normal mood and affect.  Nursing note and vitals reviewed.   ED Course  Procedures (including critical care time)  Labs Review Labs Reviewed - No data to display  Imaging Review No results found.   Visual Acuity Review  Right Eye Distance:   Left Eye Distance:   Bilateral Distance:    Right Eye Near:   Left Eye Near:    Bilateral Near:         MDM   1. Asthmatic bronchitis, moderate persistent, uncomplicated     After The first (DuoNeb plus) auscultation reveals modest improvement in air movement but there continues to be diffuse lower bilateral coarseness and wheezing and prolonged expiratory phase. Repeat albuterol once. After the second albuterol nebulizer the patient states he is breathing well better and able to cough some of the secretions up. Although there is continued improvement in air movement still has bilateral coarseness and wheezes. He was given the option to be transferred to the emergency department for serial nebs until he is more clear however he chooses to be discharged home since he is feeling better, use his albuterol HFA 2 puffs every 4 hours, restart prednisone and follow-up with his PCP early this week. If he develops fever, night sweats coming increase in trouble breathing he needs to seek medical attention promptly either  amphetamines going to the emergency department. Pts st understands and agrees. Stable condition. Meds ordered this encounter  Medications  . ipratropium-albuterol (DUONEB) 0.5-2.5 (3) MG/3ML nebulizer solution 3 mL    Sig:   . albuterol (PROVENTIL) (2.5 MG/3ML) 0.083% nebulizer solution 2.5 mg    Sig:   . triamcinolone acetonide (KENALOG-40) injection 40 mg    Sig:   . albuterol (PROVENTIL) (2.5 MG/3ML) 0.083% nebulizer solution 2.5 mg    Sig:   . predniSONE (DELTASONE) 20 MG tablet    Sig: Take 3 tabs po on first day, 2 tabs second day, 2 tabs third day, 1 tab fourth day, 1 tab 5th day. Take with food.    Dispense:  9 tablet    Refill:  0    Order Specific Question:  Supervising Provider    Answer:  Billy Fischer [phone]  . benzonatate (TESSALON) 100 MG capsule    Sig: Take 1 capsule (100  mg total) by mouth every 8 (eight) hours.    Dispense:  21 capsule    Refill:  0    Order Specific Question:  Supervising Provider    Answer:  Billy Fischer [[id number]]       Janne Napoleon, NP 09/03/15 2022

## 2015-09-05 ENCOUNTER — Telehealth: Payer: Self-pay | Admitting: Family Medicine

## 2015-09-05 ENCOUNTER — Encounter: Payer: Self-pay | Admitting: Family Medicine

## 2015-09-05 NOTE — Telephone Encounter (Signed)
Advised patient wife patient needs to go to ER.

## 2015-09-05 NOTE — Telephone Encounter (Signed)
Patient wife would like patient to get a nebulizer.  He was told this will help his breathing issue.  Advised patient wife that if he is still having that much distress he needs to go to the ER.

## 2015-09-05 NOTE — Telephone Encounter (Signed)
Patients wife sue calling regarding her husbands illness, bronchitis, and a nebulizer possibly   [phone]

## 2015-09-05 NOTE — Telephone Encounter (Signed)
Pt seen here and at [facility] with bronchitis,sob,  wheezing, if he is still having difficulty he needs  CXR and IV medications if this is indeed bronchitis, I recommend he go to ER ASAP  If he declines he needs to be seen in the office before nebulizer can be given

## 2015-09-06 ENCOUNTER — Other Ambulatory Visit: Payer: Self-pay | Admitting: Family Medicine

## 2015-09-06 ENCOUNTER — Encounter: Payer: Self-pay | Admitting: Family Medicine

## 2015-09-07 MED ORDER — METFORMIN HCL 500 MG PO TABS: 1000.0000 mg | ORAL_TABLET | Freq: Two times a day (BID) | ORAL | Status: DC

## 2015-09-07 NOTE — Telephone Encounter (Signed)
Refill appropriate and filled per protocol. 

## 2015-09-08 ENCOUNTER — Ambulatory Visit (HOSPITAL_COMMUNITY)
Admission: RE | Admit: 2015-09-08 | Discharge: 2015-09-08 | Disposition: A | Discharge status: Home / Self Care | Payer: 59 | Source: Ambulatory Visit | Attending: Family Medicine | Admitting: Family Medicine

## 2015-09-08 ENCOUNTER — Encounter: Payer: Self-pay | Admitting: Family Medicine

## 2015-09-08 ENCOUNTER — Ambulatory Visit (INDEPENDENT_AMBULATORY_CARE_PROVIDER_SITE_OTHER): Payer: 59 | Admitting: Family Medicine

## 2015-09-08 ENCOUNTER — Other Ambulatory Visit: Payer: Self-pay | Admitting: *Deleted

## 2015-09-08 VITALS — BP 132/84 | HR 99 | Temp 99.5°F | Resp 16 | Ht 71.0 in | Wt 334.0 lb

## 2015-09-08 DIAGNOSIS — J208 Acute bronchitis due to other specified organisms: Secondary | ICD-10-CM | POA: Diagnosis not present

## 2015-09-08 DIAGNOSIS — R062 Wheezing: Secondary | ICD-10-CM

## 2015-09-08 DIAGNOSIS — J984 Other disorders of lung: Secondary | ICD-10-CM | POA: Insufficient documentation

## 2015-09-08 MED ORDER — PREDNISONE 20 MG PO TABS: 60.0000 mg | ORAL_TABLET | Freq: Every day | ORAL | Status: DC

## 2015-09-08 MED ORDER — LEVOFLOXACIN 500 MG PO TABS: 500.0000 mg | ORAL_TABLET | Freq: Every day | ORAL | Status: DC

## 2015-09-08 NOTE — Progress Notes (Signed)
Subjective:    Patient ID: Eric Johnson, male    DOB: [birth year], 55 y.o.   MRN: [id number]  URI   08/29/15 Patient's wife was recently diagnosed with asthmatic bronchitis. She was given prednisone and albuterol. She took those medications and her symptoms improved. I also gave her a Z-Pak in case she develops a high fever or purulent sputum. She never had to fill that prescription. Her husband developed similar symptoms last week. He went ahead and took the Z-Pak. His symptoms have not improved. In fact he is more short of breath. He is audibly wheezing. He reports chest congestion that he is unable to bring up. He denies any fevers or chills or purulent sputum. On examination today he has diminished breath sounds bilaterally with expiratory wheezes and rales.  At that time, my plan was: I explained to the patient a believe he has viral bronchitis and as a result the antibiotic is not helping. I see no evidence of pneumonia. He does have a lot of chest congestion. I recommended Mucinex as an expectorant. However I will treat his reactive airway disease/bronchitis with a prednisone taper pack and albuterol 2 puffs inhaled every 6 hours as needed. Recheck in 48 hours if no better or sooner if worse.  09/08/15 Patient has not been able to return to work since that time area and he continues to wheeze. He also has a low-grade fever 99.5. His pulse oximetry initially checked in at 88% however it was not reading his finger very well. I placed the pulse oximeter on a different finger and recorded 96% on room air. On his examination today he has diminished breath sounds bilaterally with faint expiratory wheezes and also rhonchorous breath sounds in all 4 fields. He was seen in urgent care on June 4 and was given prednisone and albuterol. His symptoms did not improve even after that.  Past Medical History  Diagnosis Date  . Bronchitis   . Diabetes mellitus   . Hypercholesteremia   . Hypertension   .  Morbid obesity ([place]) 10/30/2012   Past Surgical History  Procedure Laterality Date  . Kidney stone removal    . Colonoscopy w/ polypectomy  2000  . Colonoscopy N/A 08/27/2013    Procedure: COLONOSCOPY;  Surgeon: Daneil Dolin, MD;  [facility];  Service: Endoscopy;  Laterality: N/A;  11:30 AM   Current Outpatient Prescriptions on File Prior to Visit  Medication Sig Dispense Refill  . albuterol (PROVENTIL HFA;VENTOLIN HFA) 108 (90 Base) MCG/ACT inhaler Inhale 2 puffs into the lungs every 6 (six) hours as needed for wheezing or shortness of breath. 1 Inhaler 0  . benzonatate (TESSALON) 100 MG capsule Take 1 capsule (100 mg total) by mouth every 8 (eight) hours. 21 capsule 0  . beta carotene w/minerals (OCUVITE) tablet Take 1 tablet by mouth daily.    . Cyanocobalamin (VITAMIN B 12 PO) Take by mouth.    . furosemide (LASIX) 40 MG tablet TAKE ONE TABLET BY MOUTH ONCE DAILY 90 tablet 0  . losartan (COZAAR) 100 MG tablet Take 1 tablet (100 mg total) by mouth daily. 90 tablet 1  . metFORMIN (GLUCOPHAGE) 500 MG tablet Take 2 tablets (1,000 mg total) by mouth 2 (two) times daily with a meal. 360 tablet 0  . potassium chloride SA (K-DUR,KLOR-CON) 20 MEQ tablet Take 1 tablet (20 mEq total) by mouth daily. 90 tablet 3  . predniSONE (DELTASONE) 20 MG tablet Take 3 tabs po on first day, 2  tabs second day, 2 tabs third day, 1 tab fourth day, 1 tab 5th day. Take with food. 9 tablet 0  . saw palmetto 160 MG capsule Take 160 mg by mouth daily.     . simvastatin (ZOCOR) 40 MG tablet Take 1 tablet (40 mg total) by mouth at bedtime. 90 tablet 1   No current facility-administered medications on file prior to visit.   Allergies  Allergen Reactions  . Penicillins    Social History   Social History  . Marital Status: Married    Spouse Name: N/A  . Number of Children: N/A  . Years of Education: N/A   Occupational History  . Not on file.   Social History Main Topics  . Smoking status: Current  Some Day Smoker -- 5 years    Types: Cigars  . Smokeless tobacco: Former Systems developer    Types: Snuff  . Alcohol Use: 0.0 oz/week    0 Standard drinks or equivalent per week     Comment: Occ wine  . Drug Use: No  . Sexual Activity: Not on file   Other Topics Concern  . Not on file   Social History Narrative      Review of Systems  All other systems reviewed and are negative.      Objective:   Physical Exam  HENT:  Right Ear: External ear normal.  Left Ear: External ear normal.  Nose: Nose normal.  Mouth/Throat: Oropharynx is clear and moist. No oropharyngeal exudate.  Neck: Neck supple.  Cardiovascular: Normal rate, regular rhythm and normal heart sounds.   Pulmonary/Chest: Effort normal. He has wheezes. He has rales.  Vitals reviewed.         Assessment & Plan:  Acute bronchitis due to other specified organisms - Plan: DG Chest 2 View  Wheezing   given the patient's persistent cough, now a low-grade fever 9 days into the infection, wheezing unresponsive to albuterol and prednisone, I am concerned that the patient may have developed walking pneumonia. I immediately want him to go get a chest x-ray to rule this out. If the chest x-ray in fact shows walking pneumonia, we will need to treat the patient for community-acquired pneumonia allergies Levaquin 750 mg daily for 10 days. If chest x-ray shows no evidence of pneumonia, I will treat the patient with high-dose steroids 60 mg daily for 7 days in addition to albuterol 2 puffs inhaled every 4 hours and add  anoro 1 inh daily.  Once the patient has recovered, he will need to be screened for COPD .

## 2015-09-11 ENCOUNTER — Ambulatory Visit (INDEPENDENT_AMBULATORY_CARE_PROVIDER_SITE_OTHER): Payer: 59 | Admitting: Family Medicine

## 2015-09-11 ENCOUNTER — Encounter: Payer: Self-pay | Admitting: Family Medicine

## 2015-09-11 VITALS — BP 118/84 | HR 78 | Temp 98.7°F | Resp 18 | Ht 71.0 in | Wt 343.0 lb

## 2015-09-11 DIAGNOSIS — R062 Wheezing: Secondary | ICD-10-CM | POA: Diagnosis not present

## 2015-09-11 DIAGNOSIS — J189 Pneumonia, unspecified organism: Secondary | ICD-10-CM

## 2015-09-11 NOTE — Progress Notes (Signed)
Subjective:    Patient ID: Eric Johnson, male    DOB: Jul 23, 1960, 55 y.o.   MRN: [id number]  URI   08/29/15 Patient's wife was recently diagnosed with asthmatic bronchitis. She was given prednisone and albuterol. She took those medications and her symptoms improved. I also gave her a Z-Pak in case she develops a high fever or purulent sputum. She never had to fill that prescription. Her husband developed similar symptoms last week. He went ahead and took the Z-Pak. His symptoms have not improved. In fact he is more short of breath. He is audibly wheezing. He reports chest congestion that he is unable to bring up. He denies any fevers or chills or purulent sputum. On examination today he has diminished breath sounds bilaterally with expiratory wheezes and rales.  At that time, my plan was: I explained to the patient a believe he has viral bronchitis and as a result the antibiotic is not helping. I see no evidence of pneumonia. He does have a lot of chest congestion. I recommended Mucinex as an expectorant. However I will treat his reactive airway disease/bronchitis with a prednisone taper pack and albuterol 2 puffs inhaled every 6 hours as needed. Recheck in 48 hours if no better or sooner if worse.  09/08/15 Patient has not been able to return to work since that time area and he continues to wheeze. He also has a low-grade fever 99.5. His pulse oximetry initially checked in at 88% however it was not reading his finger very well. I placed the pulse oximeter on a different finger and recorded 96% on room air. On his examination today he has diminished breath sounds bilaterally with faint expiratory wheezes and also rhonchorous breath sounds in all 4 fields. He was seen in urgent care on June 4 and was given prednisone and albuterol. His symptoms did not improve even after that. At that time, my plan was:  given the patient's persistent cough, now a low-grade fever 9 days into the infection, wheezing  unresponsive to albuterol and prednisone, I am concerned that the patient may have developed walking pneumonia. I immediately want him to go get a chest x-ray to rule this out. If the chest x-ray in fact shows walking pneumonia, we will need to treat the patient for community-acquired pneumonia allergies Levaquin 750 mg daily for 10 days. If chest x-ray shows no evidence of pneumonia, I will treat the patient with high-dose steroids 60 mg daily for 7 days in addition to albuterol 2 puffs inhaled every 4 hours and add  anoro 1 inh daily.  Once the patient has recovered, he will need to be screened for COPD .  09/11/15 Chest x-ray revealed left basilar scarring. I went ahead and treated the patient for walking pneumonia with Levaquin in addition to prednisone. He is currently on 500 mg of Levaquin daily for 7 days and prednisone 60 mg a day for 5 days. He is doing much better today. He is afebrile. His wheezing has improved. He has faint left basilar rhonchi Past Medical History  Diagnosis Date  . Bronchitis   . Diabetes mellitus   . Hypercholesteremia   . Hypertension   . Morbid obesity ([place]) 10/30/2012   Past Surgical History  Procedure Laterality Date  . Kidney stone removal    . Colonoscopy w/ polypectomy  2000  . Colonoscopy N/A 08/27/2013    Procedure: COLONOSCOPY;  Surgeon: Daneil Dolin, MD;  [facility];  Service: Endoscopy;  Laterality: N/A;  11:30 AM   Current Outpatient Prescriptions on File Prior to Visit  Medication Sig Dispense Refill  . albuterol (PROVENTIL HFA;VENTOLIN HFA) 108 (90 Base) MCG/ACT inhaler Inhale 2 puffs into the lungs every 6 (six) hours as needed for wheezing or shortness of breath. 1 Inhaler 0  . benzonatate (TESSALON) 100 MG capsule Take 1 capsule (100 mg total) by mouth every 8 (eight) hours. 21 capsule 0  . beta carotene w/minerals (OCUVITE) tablet Take 1 tablet by mouth daily.    . Cyanocobalamin (VITAMIN B 12 PO) Take by mouth.    . furosemide  (LASIX) 40 MG tablet TAKE ONE TABLET BY MOUTH ONCE DAILY 90 tablet 0  . levofloxacin (LEVAQUIN) 500 MG tablet Take 1 tablet (500 mg total) by mouth daily. 7 tablet 0  . losartan (COZAAR) 100 MG tablet Take 1 tablet (100 mg total) by mouth daily. 90 tablet 1  . metFORMIN (GLUCOPHAGE) 500 MG tablet Take 2 tablets (1,000 mg total) by mouth 2 (two) times daily with a meal. 360 tablet 0  . potassium chloride SA (K-DUR,KLOR-CON) 20 MEQ tablet Take 1 tablet (20 mEq total) by mouth daily. 90 tablet 3  . predniSONE (DELTASONE) 20 MG tablet Take 3 tablets (60 mg total) by mouth daily with breakfast. 15 tablet 0  . saw palmetto 160 MG capsule Take 160 mg by mouth daily.     . simvastatin (ZOCOR) 40 MG tablet Take 1 tablet (40 mg total) by mouth at bedtime. 90 tablet 1   No current facility-administered medications on file prior to visit.   Allergies  Allergen Reactions  . Penicillins    Social History   Social History  . Marital Status: Married    Spouse Name: N/A  . Number of Children: N/A  . Years of Education: N/A   Occupational History  . Not on file.   Social History Main Topics  . Smoking status: Current Some Day Smoker -- 5 years    Types: Cigars  . Smokeless tobacco: Former Systems developer    Types: Snuff  . Alcohol Use: 0.0 oz/week    0 Standard drinks or equivalent per week     Comment: Occ wine  . Drug Use: No  . Sexual Activity: Not on file   Other Topics Concern  . Not on file   Social History Narrative      Review of Systems  All other systems reviewed and are negative.      Objective:   Physical Exam  HENT:  Right Ear: External ear normal.  Left Ear: External ear normal.  Nose: Nose normal.  Mouth/Throat: Oropharynx is clear and moist. No oropharyngeal exudate.  Neck: Neck supple.  Cardiovascular: Normal rate, regular rhythm and normal heart sounds.   Pulmonary/Chest: Effort normal. He has wheezes. He has rales.  Vitals reviewed.         Assessment & Plan:    Walking pneumonia I truly believe the patient had early walking pneumonia. Complete Levaquin. Complete the prednisone. I will anticipate improvement from this point out. I believe he can likely return to work in 39 hours. Perform pulmonary function test at his next office visit to determine if he has underlying COPD

## 2015-09-15 ENCOUNTER — Encounter: Payer: Self-pay | Admitting: Family Medicine

## 2015-09-18 ENCOUNTER — Encounter: Payer: Self-pay | Admitting: Family Medicine

## 2015-10-09 ENCOUNTER — Encounter: Payer: Self-pay | Admitting: Family Medicine

## 2015-11-08 ENCOUNTER — Other Ambulatory Visit: Payer: Self-pay | Admitting: Family Medicine

## 2015-12-08 ENCOUNTER — Other Ambulatory Visit: Payer: Self-pay | Admitting: Family Medicine

## 2015-12-11 ENCOUNTER — Ambulatory Visit (INDEPENDENT_AMBULATORY_CARE_PROVIDER_SITE_OTHER): Payer: 59 | Admitting: Family Medicine

## 2015-12-11 ENCOUNTER — Encounter: Payer: Self-pay | Admitting: Family Medicine

## 2015-12-11 VITALS — BP 108/68 | HR 78 | Temp 98.3°F | Resp 18 | Ht 71.0 in | Wt 315.0 lb

## 2015-12-11 DIAGNOSIS — I10 Essential (primary) hypertension: Secondary | ICD-10-CM | POA: Diagnosis not present

## 2015-12-11 DIAGNOSIS — T783XXA Angioneurotic edema, initial encounter: Secondary | ICD-10-CM | POA: Diagnosis not present

## 2015-12-11 MED ORDER — HYDROCHLOROTHIAZIDE 25 MG PO TABS: 25.0000 mg | ORAL_TABLET | Freq: Every day | ORAL | 3 refills | Status: DC

## 2015-12-11 NOTE — Progress Notes (Signed)
Subjective:    Patient ID: Eric Johnson, male    DOB: 1960-06-22, 55 y.o.   MRN: [id number]  HPI Saturday, the patient developed sudden swelling in his lips take with the left upper lip, around his left eye and on his left cheek and around his left nostril. This occurred without provocation. He denies any pain around his eye or in his cheek bone or in his teeth. He does have several cavities in his mouth. He has a cavity down to the gumline in his upper premolar on the left side. However there is no erythema or pain in this area. He has no sinus pain. There is no tenderness to percussion on his jaw bone on his maxilla Past Medical History:  Diagnosis Date  . Bronchitis   . Diabetes mellitus   . Hypercholesteremia   . Hypertension   . Morbid obesity ([place]) 10/30/2012   Past Surgical History:  Procedure Laterality Date  . COLONOSCOPY N/A 08/27/2013   Procedure: COLONOSCOPY;  Surgeon: Daneil Dolin, MD;  [facility];  Service: Endoscopy;  Laterality: N/A;  11:30 AM  . COLONOSCOPY W/ POLYPECTOMY  2000  . kidney stone removal     Current Outpatient Prescriptions on File Prior to Visit  Medication Sig Dispense Refill  . albuterol (PROVENTIL HFA;VENTOLIN HFA) 108 (90 Base) MCG/ACT inhaler Inhale 2 puffs into the lungs every 6 (six) hours as needed for wheezing or shortness of breath. 1 Inhaler 0  . beta carotene w/minerals (OCUVITE) tablet Take 1 tablet by mouth daily.    . Cyanocobalamin (VITAMIN B 12 PO) Take by mouth.    . furosemide (LASIX) 40 MG tablet TAKE ONE TABLET BY MOUTH ONCE DAILY 90 tablet 2  . losartan (COZAAR) 100 MG tablet Take 1 tablet (100 mg total) by mouth daily. 90 tablet 1  . metFORMIN (GLUCOPHAGE) 500 MG tablet Take 2 tablets (1,000 mg total) by mouth 2 (two) times daily with a meal. 360 tablet 0  . potassium chloride SA (K-DUR,KLOR-CON) 20 MEQ tablet Take 1 tablet (20 mEq total) by mouth daily. 90 tablet 3  . saw palmetto 160 MG capsule Take 160 mg by mouth  daily.     . simvastatin (ZOCOR) 40 MG tablet Take 1 tablet (40 mg total) by mouth at bedtime. 90 tablet 1   No current facility-administered medications on file prior to visit.    Allergies  Allergen Reactions  . Losartan Swelling  . Penicillins    Social History   Social History  . Marital status: Married    Spouse name: N/A  . Number of children: N/A  . Years of education: N/A   Occupational History  . Not on file.   Social History Main Topics  . Smoking status: Current Some Day Smoker    Years: 5.00    Types: Cigars  . Smokeless tobacco: Former Systems developer    Types: Snuff  . Alcohol use 0.0 oz/week     Comment: Occ wine  . Drug use: No  . Sexual activity: Not on file   Other Topics Concern  . Not on file   Social History Narrative  . No narrative on file     Review of Systems  All other systems reviewed and are negative.      Objective:   Physical Exam  Constitutional: He appears well-developed and well-nourished.  HENT:  Head: Normocephalic and atraumatic.  Right Ear: External ear normal.  Left Ear: External ear normal.  Nose: Nose  normal.  Mouth/Throat: Oropharynx is clear and moist.  Eyes: Conjunctivae are normal.  Neck: Neck supple.  Cardiovascular: Normal rate, regular rhythm, normal heart sounds and intact distal pulses.   No murmur heard. Pulmonary/Chest: Effort normal and breath sounds normal. No respiratory distress. He has no wheezes. He has no rales.  Lymphadenopathy:    He has no cervical adenopathy.  Vitals reviewed.         Assessment & Plan:  Benign essential HTN - Plan: hydrochlorothiazide (HYDRODIURIL) 25 MG tablet  Angioedema, initial encounter  I suspect that this is angioedema. I recommended the patient discontinue losartan and replace with hydrochlorothiazide 25 mg a day. Also recommended he follow-up with a dentist to deal with the cavities in his mouth to prevent future dental infection

## 2015-12-14 ENCOUNTER — Encounter: Payer: Self-pay | Admitting: Family Medicine

## 2015-12-18 ENCOUNTER — Other Ambulatory Visit: Payer: 59

## 2015-12-18 ENCOUNTER — Encounter: Payer: Self-pay | Admitting: Family Medicine

## 2015-12-18 DIAGNOSIS — I10 Essential (primary) hypertension: Secondary | ICD-10-CM

## 2015-12-18 DIAGNOSIS — E785 Hyperlipidemia, unspecified: Secondary | ICD-10-CM

## 2015-12-18 DIAGNOSIS — E119 Type 2 diabetes mellitus without complications: Secondary | ICD-10-CM

## 2015-12-18 LAB — LIPID PANEL
Cholesterol: 90 mg/dL — ABNORMAL LOW (ref 125–200)
HDL: 37 mg/dL — ABNORMAL LOW (ref >=40)
LDL Cholesterol: 22 mg/dL (ref <130)
Total CHOL/HDL Ratio: 2.4 Ratio (ref <=5.0)
Triglycerides: 155 mg/dL — ABNORMAL HIGH (ref <150)
VLDL: 31 mg/dL — ABNORMAL HIGH (ref <30)

## 2015-12-18 LAB — CBC WITH DIFFERENTIAL/PLATELET
Basophils Absolute: 59 cells/uL (ref 0–200)
Basophils Relative: 1 %
Eosinophils Absolute: 177 cells/uL (ref 15–500)
Eosinophils Relative: 3 %
HCT: 42.9 % (ref 38.5–50.0)
Hemoglobin: 14.4 g/dL (ref 13.0–17.0)
Lymphocytes Relative: 30 %
Lymphs Abs: 1770 cells/uL (ref 850–3900)
MCH: 31.9 pg (ref 27.0–33.0)
MCHC: 33.6 g/dL (ref 32.0–36.0)
MCV: 95.1 fL (ref 80.0–100.0)
MPV: 10 fL (ref 7.5–12.5)
Monocytes Absolute: 708 cells/uL (ref 200–950)
Monocytes Relative: 12 %
Neutro Abs: 3186 cells/uL (ref 1500–7800)
Neutrophils Relative %: 54 %
Platelets: 247 10*3/uL (ref 140–400)
RBC: 4.51 MIL/uL (ref 4.20–5.80)
RDW: 14.4 % (ref 11.0–15.0)
WBC: 5.9 10*3/uL (ref 3.8–10.8)

## 2015-12-18 LAB — COMPREHENSIVE METABOLIC PANEL
ALT: 25 U/L (ref 9–46)
AST: 22 U/L (ref 10–35)
Albumin: 4 g/dL (ref 3.6–5.1)
Alkaline Phosphatase: 50 U/L (ref 40–115)
BUN: 13 mg/dL (ref 7–25)
CO2: 28 mmol/L (ref 20–31)
Calcium: 9.1 mg/dL (ref 8.6–10.3)
Chloride: 101 mmol/L (ref 98–110)
Creat: 0.84 mg/dL (ref 0.70–1.33)
Glucose, Bld: 114 mg/dL — ABNORMAL HIGH (ref 70–99)
Potassium: 3.4 mmol/L — ABNORMAL LOW (ref 3.5–5.3)
Sodium: 139 mmol/L (ref 135–146)
Total Bilirubin: 0.5 mg/dL (ref 0.2–1.2)
Total Protein: 6.5 g/dL (ref 6.1–8.1)

## 2015-12-19 LAB — HEMOGLOBIN A1C
Hgb A1c MFr Bld: 6 % — ABNORMAL HIGH (ref <5.7)
Mean Plasma Glucose: 126 mg/dL

## 2016-01-01 ENCOUNTER — Other Ambulatory Visit: Payer: Self-pay | Admitting: Family Medicine

## 2016-01-01 MED ORDER — SIMVASTATIN 40 MG PO TABS: 40.0000 mg | ORAL_TABLET | Freq: Every day | ORAL | 1 refills | Status: DC

## 2016-01-01 NOTE — Telephone Encounter (Signed)
Medication called/sent to requested pharmacy  

## 2016-01-14 IMAGING — CT CT CHEST W/ CM
2 of 3 series · 15 of 36 positions shown, 18 images · IV contrast (Omnipaque 300)
Comparison: CT abdomen pelvis of 04/25/2014

CLINICAL DATA: Left lower lobe lung nodule on CT abdomen pelvis,
followup

EXAM:
CT CHEST WITH CONTRAST
TECHNIQUE: Multidetector CT imaging of the chest was performed during
intravenous contrast administration.
CONTRAST:  75mL OMNIPAQUE IOHEXOL 300 MG/ML  SOLN

[Series 2: chestroutine 5.0 b40f · axial · 0.84mm/px · z∈[-396,-92]mm · 12 of 73 slices shown, 15 images]
[im 6/73  mediastinal]
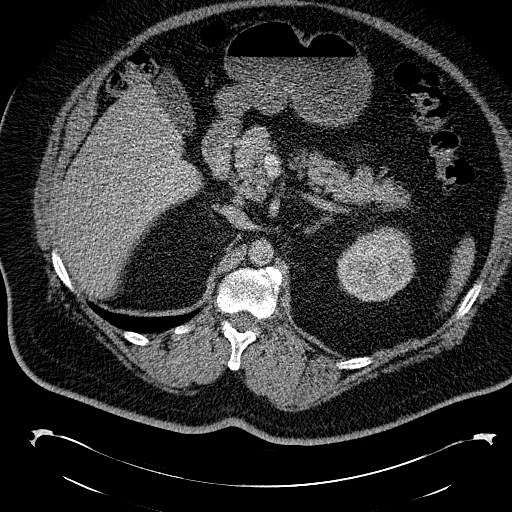
[im 6/73  lung]
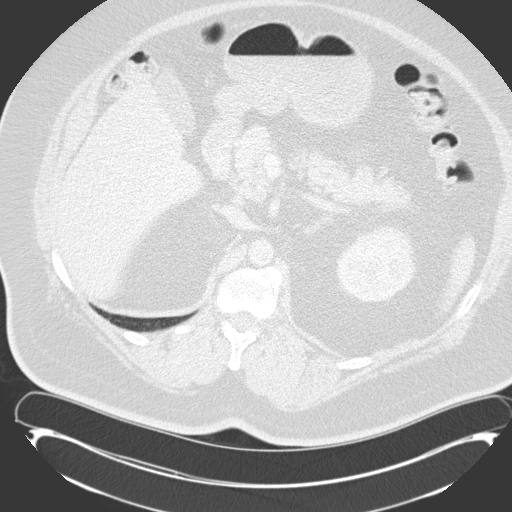
[im 11/73  lung]
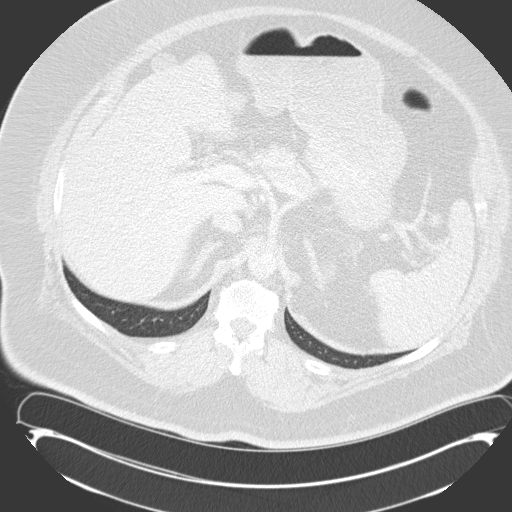
[im 17/73  lung]
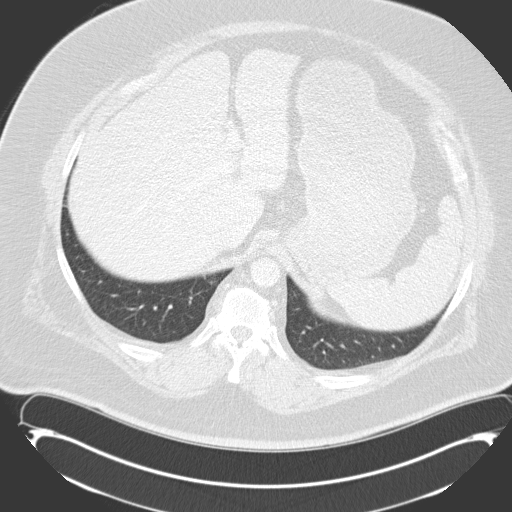
[im 22/73  lung]
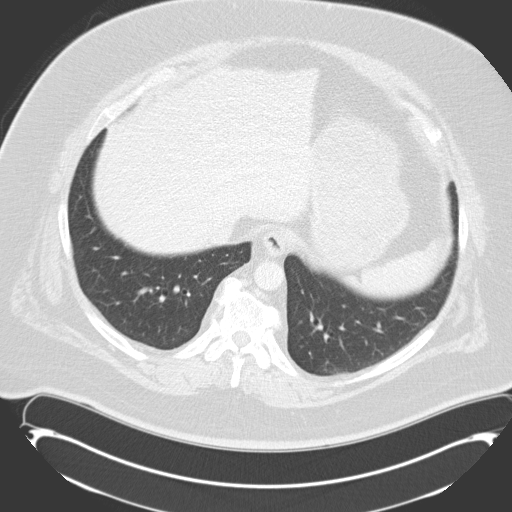
[im 27/73  mediastinal]
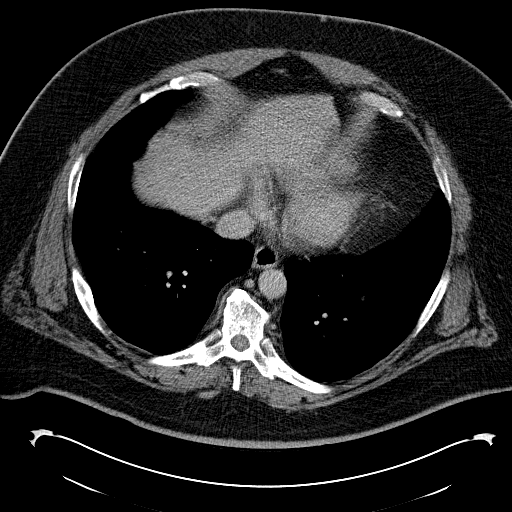
[im 27/73  lung]
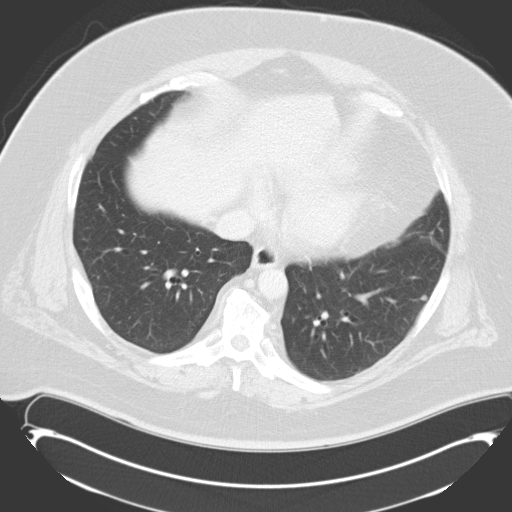
[im 33/73  lung]
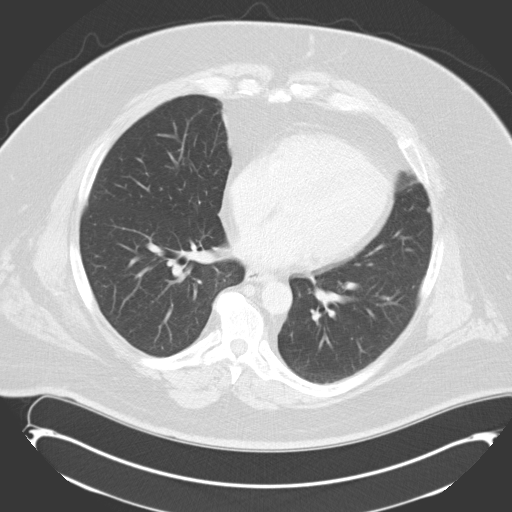
[im 41/73  lung]
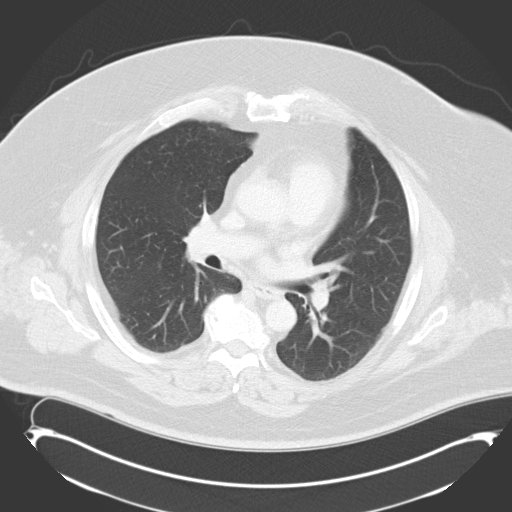
[im 46/73  lung]
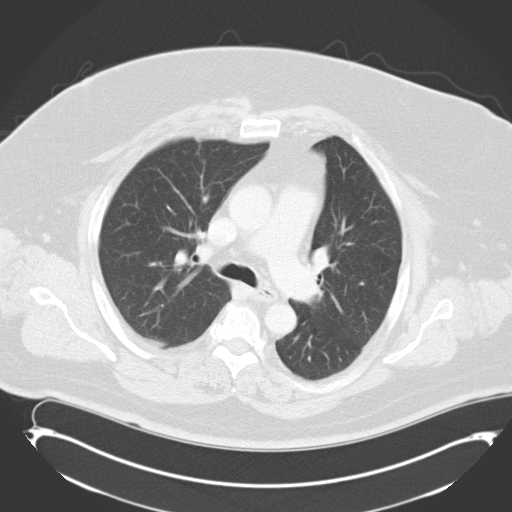
[im 51/73  mediastinal]
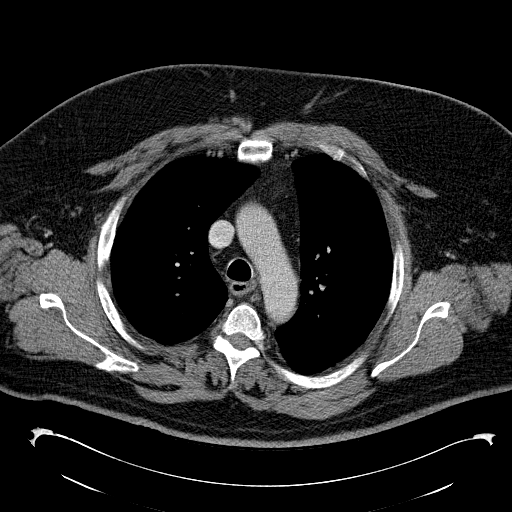
[im 51/73  lung]
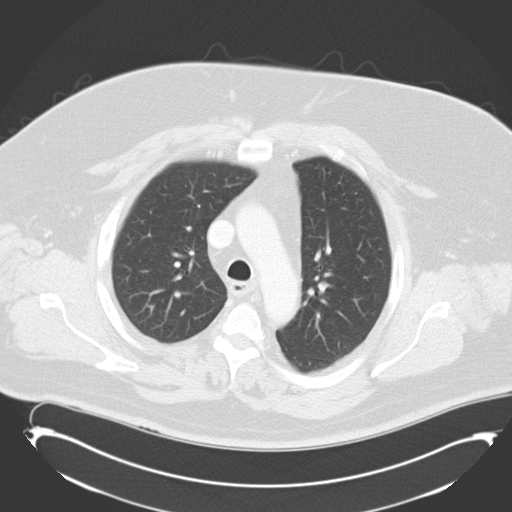
[im 57/73  lung]
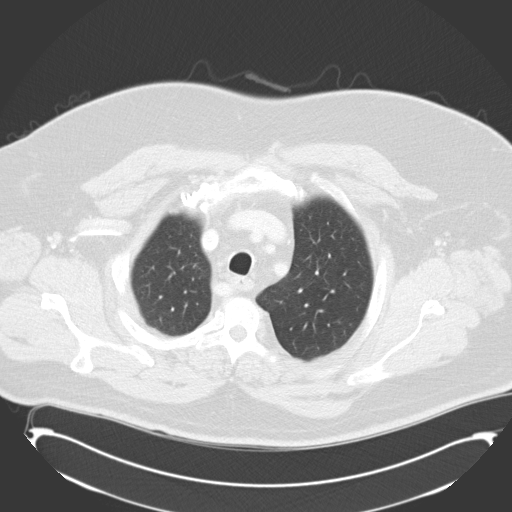
[im 62/73  lung]
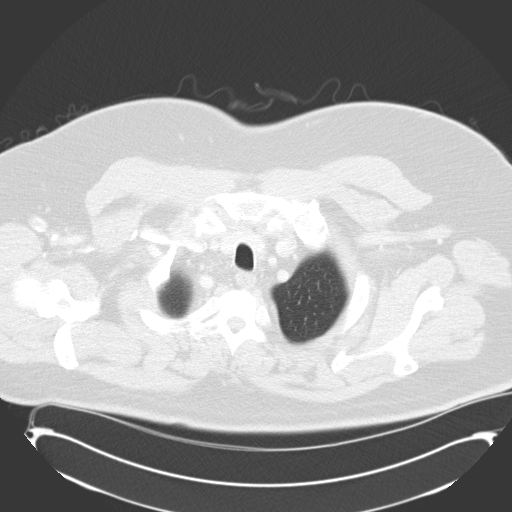
[im 67/73  lung]
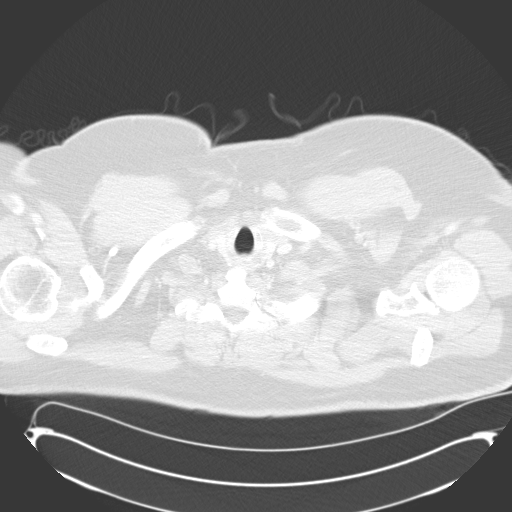

[Series 4: mpr coronal chest 3mm · coronal · 0.71mm/px · 3 of 127 slices shown]
[im 26/127  lung]
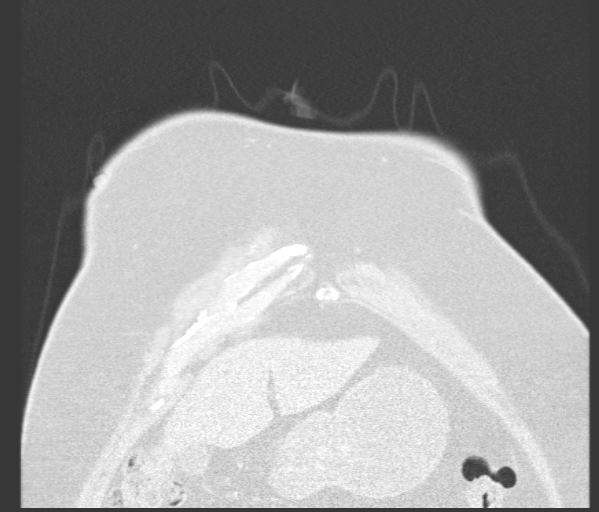
[im 51/127  lung]
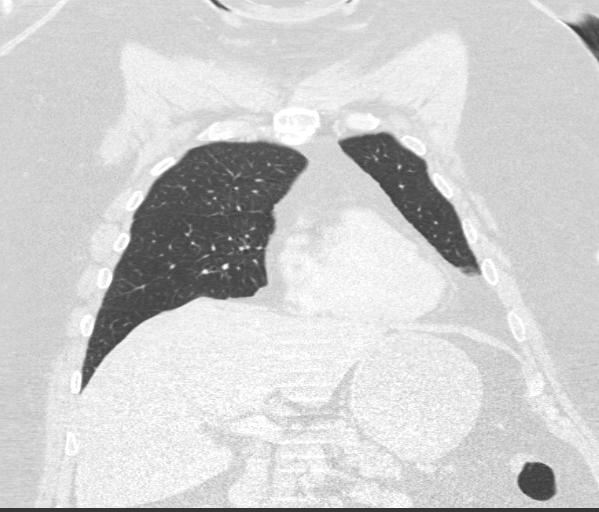
[im 76/127  lung]
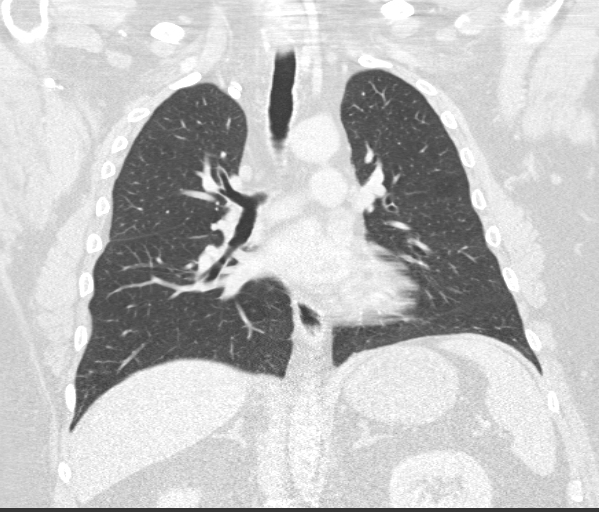

[15 of 36 positions shown; findings below may reference images not displayed]

FINDINGS: The 5 mm noncalcified subpleural left lower lobe lung nodule is
again visualized and is unchanged. A 4 mm noncalcified pleural-based
nodule is noted posteriorly in the inferior right lower lobe on
image 59 which also is stable compared to the prior CT through the
lung bases. No additional lung nodule is seen. No focal infiltrate
is noted and there is no evidence of pleural effusion. The central
airway is patent.

On soft tissue window images, the thyroid gland is unremarkable. An
aberrant right subclavian artery is noted coursing posterior to the
esophagus. The pulmonary arteries and thoracic aorta opacify with no
significant abnormality. No mediastinal or hilar adenopathy is seen.
The portion of the upper abdomen that is visualized is unremarkable
other than a probable gallstone layering in the neck of the
gallbladder. There are degenerative changes throughout the mid to
lower thoracic spine.
IMPRESSION: 1. Stable 5 mm noncalcified nodule in the left lower lobe with
stable 4 mm nodule pleural-based in the right lower lobe. These
nodules most likely are benign but followup CT chest in 6-12 months
is recommended to assess stability.
2. Incidental gallstone within the gallbladder.
3. Variation of an aberrant right subclavian artery coursing
posterior to the esophagus.

## 2016-01-26 ENCOUNTER — Encounter: Payer: Self-pay | Admitting: Family Medicine

## 2016-04-16 ENCOUNTER — Encounter: Payer: Self-pay | Admitting: Family Medicine

## 2016-05-08 ENCOUNTER — Encounter: Payer: Self-pay | Admitting: Physician Assistant

## 2016-05-08 ENCOUNTER — Ambulatory Visit (INDEPENDENT_AMBULATORY_CARE_PROVIDER_SITE_OTHER): Payer: 59 | Admitting: Physician Assistant

## 2016-05-08 VITALS — BP 130/88 | HR 85 | Temp 98.9°F | Resp 18 | Wt 294.4 lb

## 2016-05-08 DIAGNOSIS — J209 Acute bronchitis, unspecified: Secondary | ICD-10-CM

## 2016-05-08 DIAGNOSIS — J988 Other specified respiratory disorders: Secondary | ICD-10-CM | POA: Diagnosis not present

## 2016-05-08 DIAGNOSIS — J029 Acute pharyngitis, unspecified: Secondary | ICD-10-CM | POA: Diagnosis not present

## 2016-05-08 DIAGNOSIS — B9689 Other specified bacterial agents as the cause of diseases classified elsewhere: Secondary | ICD-10-CM

## 2016-05-08 DIAGNOSIS — J4521 Mild intermittent asthma with (acute) exacerbation: Secondary | ICD-10-CM | POA: Diagnosis not present

## 2016-05-08 LAB — STREP GROUP A AG, W/REFLEX TO CULT: STREGTOCOCCUS GROUP A AG SCREEN: NOT DETECTED

## 2016-05-08 MED ORDER — PREDNISONE 20 MG PO TABS: ORAL_TABLET | ORAL | 0 refills | Status: DC

## 2016-05-08 MED ORDER — ALBUTEROL SULFATE HFA 108 (90 BASE) MCG/ACT IN AERS: 2.0000 | INHALATION_SPRAY | Freq: Four times a day (QID) | RESPIRATORY_TRACT | 0 refills | Status: DC | PRN

## 2016-05-08 MED ORDER — LEVOFLOXACIN 500 MG PO TABS: 500.0000 mg | ORAL_TABLET | Freq: Every day | ORAL | 0 refills | Status: DC

## 2016-05-08 NOTE — Progress Notes (Signed)
Patient ID: Eric Johnson MRN: [id number], DOB: Feb 07, 1961, 56 y.o. Date of Encounter: 05/08/2016, 12:23 PM    Chief Complaint:  Chief Complaint  Patient presents with  . Sore Throat    x6days  . chest congestion  . Wheezing     HPI: 56 y.o. year old male presents with above.   States that he started getting sick 7 days ago. At that time was having runny nose and nasal congestion. 3 days ago developed some sore throat, chest congestion and started noticing some wheezing.  Has a history of bronchitis so once he started having the chest congestion and wheezing he knew he needed to come in. Smokes a couple of cigars per day but does not smoke cigarettes.     Home Meds:   Outpatient Medications Prior to Visit  Medication Sig Dispense Refill  . beta carotene w/minerals (OCUVITE) tablet Take 1 tablet by mouth daily.    . Cyanocobalamin (VITAMIN B 12 PO) Take by mouth.    . furosemide (LASIX) 40 MG tablet TAKE ONE TABLET BY MOUTH ONCE DAILY 90 tablet 2  . hydrochlorothiazide (HYDRODIURIL) 25 MG tablet Take 1 tablet (25 mg total) by mouth daily. 90 tablet 3  . losartan (COZAAR) 100 MG tablet Take 1 tablet (100 mg total) by mouth daily. 90 tablet 1  . losartan (COZAAR) 100 MG tablet TAKE ONE TABLET BY MOUTH ONCE DAILY 90 tablet 1  . metFORMIN (GLUCOPHAGE) 500 MG tablet TAKE TWO TABLETS BY MOUTH TWICE DAILY WITH  MEALS 360 tablet 1  . potassium chloride SA (K-DUR,KLOR-CON) 20 MEQ tablet Take 1 tablet (20 mEq total) by mouth daily. 90 tablet 3  . saw palmetto 160 MG capsule Take 160 mg by mouth daily.     . simvastatin (ZOCOR) 40 MG tablet Take 1 tablet (40 mg total) by mouth at bedtime. 90 tablet 1  . albuterol (PROVENTIL HFA;VENTOLIN HFA) 108 (90 Base) MCG/ACT inhaler Inhale 2 puffs into the lungs every 6 (six) hours as needed for wheezing or shortness of breath. 1 Inhaler 0   No facility-administered medications prior to visit.     Allergies:  Allergies  Allergen Reactions  .  Losartan Swelling  . Penicillins       Review of Systems: See HPI for pertinent ROS. All other ROS negative.    Physical Exam: Blood pressure 130/88, pulse 85, temperature 98.9 F (37.2 C), temperature source Oral, resp. rate 18, weight 294 lb 6.4 oz (133.5 kg), SpO2 98 %., Body mass index is 41.06 kg/m. General:  WM. Appears in no acute distress. HEENT: Normocephalic, atraumatic, eyes without discharge, sclera non-icteric, nares are without discharge. Bilateral auditory canals clear, TM's are without perforation, pearly grey and translucent with reflective cone of light bilaterally. Oral cavity moist, posterior pharynx without exudate, erythema, peritonsillar abscess.  Neck: Supple. No thyromegaly. No lymphadenopathy. Lungs: Mild wheezes throughout bilaterally. Good air movement. Oxygen Sat 98% Heart: Regular rhythm. No murmurs, rubs, or gallops. Msk:  Strength and tone normal for age. Extremities/Skin: Warm and dry. Neuro: Alert and oriented X 3. Moves all extremities spontaneously. Gait is normal. CNII-XII grossly in tact. Psych:  Responds to questions appropriately with a normal affect.   Results for orders placed or performed in visit on 05/08/16  STREP GROUP A AG, W/REFLEX TO CULT  Result Value Ref Range   SOURCE THROAT    STREGTOCOCCUS GROUP A AG SCREEN Not Detected      ASSESSMENT AND PLAN:  56 y.o. year  old male with  1. Acute bronchitis, unspecified organism Reviewed that he has penicillin allergy. He then reports that "in the past the Z-Pak did not work for him". Says that "what Dr. Dennard Schaumann ended up giving him last time worked"-- Reviewed office note by Dr. Dennard Schaumann dated 09/08/15. Also reviewed that CXR report. He was prescribed Levaquin 500 mg daily 7 days. Will use this antibiotic again. As well he needs prednisone and is to take as directed below. As well needs to use the albuterol 2 puffs every 4 hours as wheezing. To follow-up immediately if breathing worsens or fever  increases. F/U if symptoms do not resolve upon completion of antibiotic. Note given to cover out of work today through Friday. - levofloxacin (LEVAQUIN) 500 MG tablet; Take 1 tablet (500 mg total) by mouth daily.  Dispense: 7 tablet; Refill: 0 - predniSONE (DELTASONE) 20 MG tablet; Take 2 daily for 3 days then take 1 daily for 2 days  Dispense: 8 tablet; Refill: 0 - albuterol (PROVENTIL HFA;VENTOLIN HFA) 108 (90 Base) MCG/ACT inhaler; Inhale 2 puffs into the lungs every 6 (six) hours as needed for wheezing or shortness of breath.  Dispense: 1 Inhaler; Refill: 0  2. Bacterial respiratory infection - levofloxacin (LEVAQUIN) 500 MG tablet; Take 1 tablet (500 mg total) by mouth daily.  Dispense: 7 tablet; Refill: 0  3. Sore throat - STREP GROUP A AG, W/REFLEX TO CULT  4. Mild intermittent reactive airway disease with acute exacerbation - albuterol (PROVENTIL HFA;VENTOLIN HFA) 108 (90 Base) MCG/ACT inhaler; Inhale 2 puffs into the lungs every 6 (six) hours as needed for wheezing or shortness of breath.  Dispense: 1 Inhaler; Refill: 0   Signed, [address] [place], [place], [facility] 05/08/2016 12:23 PM

## 2016-05-11 LAB — CULTURE, GROUP A STREP

## 2016-05-17 ENCOUNTER — Encounter: Payer: Self-pay | Admitting: Physician Assistant

## 2016-05-20 ENCOUNTER — Telehealth: Payer: Self-pay

## 2016-05-20 NOTE — Telephone Encounter (Signed)
Pt was seen in office on 2-7 and was not feeling well enough to go to work on 2-12 and is asking for letter to cover his absence through 2-19.  Letter is avail for pick up. Message left for pt

## 2016-06-05 ENCOUNTER — Other Ambulatory Visit: Payer: Self-pay | Admitting: Family Medicine

## 2016-07-17 ENCOUNTER — Other Ambulatory Visit: Payer: Self-pay | Admitting: Family Medicine

## 2016-07-31 ENCOUNTER — Other Ambulatory Visit: Payer: Self-pay | Admitting: Family Medicine

## 2016-08-09 ENCOUNTER — Encounter: Payer: Self-pay | Admitting: Family Medicine

## 2016-08-09 ENCOUNTER — Ambulatory Visit (INDEPENDENT_AMBULATORY_CARE_PROVIDER_SITE_OTHER): Payer: 59 | Admitting: Family Medicine

## 2016-08-09 VITALS — BP 108/70 | HR 78 | Temp 98.3°F | Resp 18 | Ht 71.0 in | Wt 292.0 lb

## 2016-08-09 DIAGNOSIS — I10 Essential (primary) hypertension: Secondary | ICD-10-CM

## 2016-08-09 DIAGNOSIS — Z125 Encounter for screening for malignant neoplasm of prostate: Secondary | ICD-10-CM

## 2016-08-09 DIAGNOSIS — Z0000 Encounter for general adult medical examination without abnormal findings: Secondary | ICD-10-CM

## 2016-08-09 DIAGNOSIS — E119 Type 2 diabetes mellitus without complications: Secondary | ICD-10-CM

## 2016-08-09 LAB — COMPLETE METABOLIC PANEL WITH GFR
ALT: 27 U/L (ref 9–46)
AST: 22 U/L (ref 10–35)
Albumin: 4.1 g/dL (ref 3.6–5.1)
Alkaline Phosphatase: 51 U/L (ref 40–115)
BUN: 15 mg/dL (ref 7–25)
CO2: 26 mmol/L (ref 20–31)
Calcium: 8.9 mg/dL (ref 8.6–10.3)
Chloride: 106 mmol/L (ref 98–110)
Creat: 0.86 mg/dL (ref 0.70–1.33)
GFR, Est African American: >89 mL/min (ref >=60)
GFR, Est Non African American: >89 mL/min (ref >=60)
Glucose, Bld: 114 mg/dL — ABNORMAL HIGH (ref 70–99)
Potassium: 3.7 mmol/L (ref 3.5–5.3)
Sodium: 141 mmol/L (ref 135–146)
Total Bilirubin: 0.4 mg/dL (ref 0.2–1.2)
Total Protein: 6.4 g/dL (ref 6.1–8.1)

## 2016-08-09 LAB — CBC WITH DIFFERENTIAL/PLATELET
Basophils Absolute: 0 cells/uL (ref 0–200)
Basophils Relative: 0 %
Eosinophils Absolute: 150 cells/uL (ref 15–500)
Eosinophils Relative: 3 %
HCT: 42.9 % (ref 38.5–50.0)
Hemoglobin: 14.3 g/dL (ref 13.0–17.0)
Lymphocytes Relative: 28 %
Lymphs Abs: 1400 cells/uL (ref 850–3900)
MCH: 32.2 pg (ref 27.0–33.0)
MCHC: 33.3 g/dL (ref 32.0–36.0)
MCV: 96.6 fL (ref 80.0–100.0)
MPV: 9.8 fL (ref 7.5–12.5)
Monocytes Absolute: 550 cells/uL (ref 200–950)
Monocytes Relative: 11 %
Neutro Abs: 2900 cells/uL (ref 1500–7800)
Neutrophils Relative %: 58 %
Platelets: 238 10*3/uL (ref 140–400)
RBC: 4.44 MIL/uL (ref 4.20–5.80)
RDW: 14.3 % (ref 11.0–15.0)
WBC: 5 10*3/uL (ref 3.8–10.8)

## 2016-08-09 LAB — LIPID PANEL
Cholesterol: 91 mg/dL (ref <200)
HDL: 33 mg/dL — ABNORMAL LOW (ref >40)
LDL Cholesterol: 32 mg/dL (ref <100)
Total CHOL/HDL Ratio: 2.8 Ratio (ref <5.0)
Triglycerides: 130 mg/dL (ref <150)
VLDL: 26 mg/dL (ref <30)

## 2016-08-09 NOTE — Progress Notes (Signed)
Subjective:    Patient ID: Eric Johnson, male    DOB: October 26, 1960, 56 y.o.   MRN: [id number]  HPI Patient has been working very hard on diet exercise and weight loss. Wt Readings from Last 3 Encounters:  08/09/16 292 lb (132.5 kg)  05/08/16 294 lb 6.4 oz (133.5 kg)  12/11/15 (!) 315 lb (142.9 kg)   He states that he has lost approximately 50 pounds overall. He is down 23 pounds since September of last year. As a result his blood pressure is much better. The swelling in his legs is also better. The patient is taking Lasix, Klor-Con, and hydrochlorothiazide. I believe this is too much. He is here today for his physical exam. With weight loss, I am also interested in seeing what his hemoglobin A1c is and whether or not he may be able to discontinue metformin. Colonoscopy was in 2015 and is not due again until 2020 due to a history of colon polyps. Patient is due for diabetic eye exam. He declines HIV or hepatitis C screening. Immunizations are up-to-date: Immunization History  Administered Date(s) Administered  . Influenza Split 04/21/2009  . Influenza,inj,Quad PF,36+ Mos 12/25/2012  . Influenza-Unspecified 01/05/2016  . Pneumococcal Polysaccharide-23 06/13/2015  . Tdap 04/21/2009  . Zoster 10/30/2012   He will be due for Prevnar 13 when he turns 63. Past Medical History:  Diagnosis Date  . Bronchitis   . Diabetes mellitus   . Hypercholesteremia   . Hypertension   . Morbid obesity ([place]) 10/30/2012   Past Surgical History:  Procedure Laterality Date  . COLONOSCOPY N/A 08/27/2013   Procedure: COLONOSCOPY;  Surgeon: Daneil Dolin, MD;  Location: [facility];  Service: Endoscopy;  Laterality: N/A;  11:30 AM  . COLONOSCOPY W/ POLYPECTOMY  2000  . kidney stone removal     Current Outpatient Prescriptions on File Prior to Visit  Medication Sig Dispense Refill  . beta carotene w/minerals (OCUVITE) tablet Take 1 tablet by mouth daily.    . Cyanocobalamin (VITAMIN B 12 PO) Take by  mouth.    . hydrochlorothiazide (HYDRODIURIL) 25 MG tablet Take 1 tablet (25 mg total) by mouth daily. 90 tablet 3  . metFORMIN (GLUCOPHAGE) 500 MG tablet TAKE TWO TABLETS BY MOUTH TWICE DAILY WITH MEALS 360 tablet 1  . saw palmetto 160 MG capsule Take 160 mg by mouth daily.     . simvastatin (ZOCOR) 40 MG tablet Take 1 tablet (40 mg total) by mouth at bedtime. 90 tablet 1  . furosemide (LASIX) 40 MG tablet TAKE ONE TABLET BY MOUTH ONCE DAILY (Patient not taking: Reported on 08/09/2016) 90 tablet 2  . KLOR-CON M20 20 MEQ tablet TAKE ONE TABLET BY MOUTH ONCE DAILY (Patient not taking: Reported on 08/09/2016) 90 tablet 3   No current facility-administered medications on file prior to visit.    Allergies  Allergen Reactions  . Losartan Swelling  . Penicillins    Social History   Social History  . Marital status: Married    Spouse name: N/A  . Number of children: N/A  . Years of education: N/A   Occupational History  . Not on file.   Social History Main Topics  . Smoking status: Current Some Day Smoker    Years: 5.00    Types: Cigars  . Smokeless tobacco: Former Systems developer    Types: Snuff  . Alcohol use 0.0 oz/week     Comment: Occ wine  . Drug use: No  . Sexual activity: Not on  file   Other Topics Concern  . Not on file   Social History Narrative  . No narrative on file   Family History  Problem Relation Age of Onset  . Heart Problems Mother       Review of Systems  All other systems reviewed and are negative.      Objective:   Physical Exam  Constitutional: He is oriented to person, place, and time. He appears well-developed and well-nourished. No distress.  HENT:  Head: Normocephalic and atraumatic.  Right Ear: External ear normal.  Left Ear: External ear normal.  Nose: Nose normal.  Mouth/Throat: Oropharynx is clear and moist. No oropharyngeal exudate.  Eyes: Conjunctivae and EOM are normal. Pupils are equal, round, and reactive to light. Right eye exhibits no  discharge. Left eye exhibits no discharge. No scleral icterus.  Neck: Normal range of motion. Neck supple. No JVD present. No tracheal deviation present. No thyromegaly present.  Cardiovascular: Normal rate, regular rhythm, normal heart sounds and intact distal pulses.  Exam reveals no gallop and no friction rub.   No murmur heard. Pulmonary/Chest: Effort normal and breath sounds normal. No stridor. No respiratory distress. He has no wheezes. He has no rales. He exhibits no tenderness.  Abdominal: Soft. Bowel sounds are normal. He exhibits no distension and no mass. There is no tenderness. There is no rebound and no guarding.  Musculoskeletal: Normal range of motion. He exhibits edema. He exhibits no tenderness or deformity.  Lymphadenopathy:    He has no cervical adenopathy.  Neurological: He is alert and oriented to person, place, and time. He has normal reflexes. He displays normal reflexes. No cranial nerve deficit. He exhibits normal muscle tone. Coordination normal.  Skin: Rash noted. He is not diaphoretic.  Psychiatric: He has a normal mood and affect. His behavior is normal. Judgment and thought content normal.  Vitals reviewed.  patient has chronic venous stasis changes in both legs left greater than right. He has trace bipedal edema. He has varicose veins of both feet. Diabetic foot exam is performed today and is normal        Assessment & Plan:  Controlled type 2 diabetes mellitus without complication, without long-term current use of insulin ([place]) - Plan: CBC with Differential/Platelet, COMPLETE METABOLIC PANEL WITH GFR, Lipid panel, Hemoglobin A1c, Microalbumin, urine  Prostate cancer screening - Plan: PSA  Benign essential HTN  General medical exam  Cancer screening is up-to-date. I will check a PSA for prostate cancer screening. Also check a CBC, CMP, fasting lipid panel, A1c, and urine microalbumin. He declines HIV and hepatitis C screening. He is not due for colonoscopy  until 2020. Goal hemoglobin A1c is less than 6.5. If his A1c is less than 6, I will discontinue metformin. Because of his weight loss and is good blood pressure, I will discontinue Lasix and Klor-Con. Monitor blood pressure and if possible he may discontinue hydrochlorothiazide in the future. I will schedule the patient for diabetic eye exam.

## 2016-08-10 LAB — HEMOGLOBIN A1C
Hgb A1c MFr Bld: 5.9 % — ABNORMAL HIGH (ref <5.7)
Mean Plasma Glucose: 123 mg/dL

## 2016-08-10 LAB — MICROALBUMIN, URINE: Microalb, Ur: 0.7 mg/dL

## 2016-08-10 LAB — PSA: PSA: 0.6 ng/mL (ref <=4.0)

## 2016-08-12 ENCOUNTER — Encounter: Payer: Self-pay | Admitting: Family Medicine

## 2016-11-20 ENCOUNTER — Other Ambulatory Visit: Payer: Self-pay | Admitting: Family Medicine

## 2016-11-20 DIAGNOSIS — I10 Essential (primary) hypertension: Secondary | ICD-10-CM

## 2016-12-13 DIAGNOSIS — E119 Type 2 diabetes mellitus without complications: Secondary | ICD-10-CM | POA: Diagnosis not present

## 2016-12-13 LAB — HM DIABETES EYE EXAM

## 2017-01-03 ENCOUNTER — Encounter: Payer: Self-pay | Admitting: *Deleted

## 2017-01-03 ENCOUNTER — Encounter: Payer: Self-pay | Admitting: Family Medicine

## 2017-01-07 ENCOUNTER — Encounter: Payer: Self-pay | Admitting: Family Medicine

## 2017-11-26 ENCOUNTER — Other Ambulatory Visit: Payer: Self-pay | Admitting: Family Medicine

## 2017-11-26 DIAGNOSIS — I10 Essential (primary) hypertension: Secondary | ICD-10-CM

## 2017-12-08 ENCOUNTER — Encounter (HOSPITAL_COMMUNITY): Payer: Self-pay | Admitting: Emergency Medicine

## 2017-12-08 ENCOUNTER — Telehealth: Payer: Self-pay | Admitting: *Deleted

## 2017-12-08 ENCOUNTER — Ambulatory Visit (HOSPITAL_COMMUNITY)
Admission: EM | Admit: 2017-12-08 | Discharge: 2017-12-08 | Disposition: A | Discharge status: Home / Self Care | Payer: 59 | Attending: Family Medicine | Admitting: Family Medicine

## 2017-12-08 DIAGNOSIS — H1033 Unspecified acute conjunctivitis, bilateral: Secondary | ICD-10-CM

## 2017-12-08 MED ORDER — POLYMYXIN B-TRIMETHOPRIM 10000-0.1 UNIT/ML-% OP SOLN: 1.0000 [drp] | OPHTHALMIC | 0 refills | Status: DC

## 2017-12-08 NOTE — ED Triage Notes (Signed)
Pt c/o bilateral eye redness and drainage x1 week

## 2017-12-08 NOTE — Discharge Instructions (Addendum)
It was nice meeting you!!  We will go ahead and treat you for infection in both eyes. You can also try Pataday allergy eyedrops.  This could help with your symptoms and for future issues.  Follow up as needed for continued or worsening symptoms

## 2017-12-08 NOTE — ED Provider Notes (Signed)
[name]    CSN: [id number] Arrival date & time: 12/08/17  1125     History   Chief Complaint Chief Complaint  Patient presents with  . Conjunctivitis    HPI Eric Johnson is a 57 y.o. male.   She is a 57 year old male with bilateral eye redness, swelling, drainage, itching, blurred vision x1 week.  Reports symptoms have been constant and  worsened over the past week.  He denies any associated fever, chills. He hasn't tried anything for his symptoms.      Past Medical History:  Diagnosis Date  . Bronchitis   . Diabetes mellitus   . Hypercholesteremia   . Hypertension   . Morbid obesity ([place]) 10/30/2012    Patient Active Problem List   Diagnosis Date Noted  . Morbid obesity ([place]) 10/30/2012  . Diabetes mellitus   . Hypercholesteremia   . Hypertension     Past Surgical History:  Procedure Laterality Date  . COLONOSCOPY N/A 08/27/2013   Procedure: COLONOSCOPY;  Surgeon: Daneil Dolin, MD;  [facility];  Service: Endoscopy;  Laterality: N/A;  11:30 AM  . COLONOSCOPY W/ POLYPECTOMY  2000  . kidney stone removal         Home Medications    Prior to Admission medications   Medication Sig Start Date End Date Taking? Authorizing Provider  beta carotene w/minerals (OCUVITE) tablet Take 1 tablet by mouth daily.    [provider]  Cyanocobalamin (VITAMIN B 12 PO) Take by mouth.    [provider]  furosemide (LASIX) 40 MG tablet TAKE ONE TABLET BY MOUTH ONCE DAILY Patient not taking: Reported on 08/09/2016 08/01/16   [name], MD  hydrochlorothiazide (HYDRODIURIL) 25 MG tablet TAKE 1 TABLET BY MOUTH ONCE DAILY 11/26/17   [name], MD  KLOR-CON M20 20 MEQ tablet TAKE ONE TABLET BY MOUTH ONCE DAILY Patient not taking: Reported on 08/09/2016 07/17/16   [name], MD  metFORMIN (GLUCOPHAGE) 500 MG tablet TAKE TWO TABLETS BY MOUTH TWICE DAILY WITH MEALS Patient not taking: Reported on 12/08/2017 06/06/16    [name], MD  saw palmetto 160 MG capsule Take 160 mg by mouth daily.     [provider]  simvastatin (ZOCOR) 40 MG tablet Take 1 tablet (40 mg total) by mouth at bedtime. Patient not taking: Reported on 12/08/2017 01/01/16   [name], MD  trimethoprim-polymyxin b Mayra Neer) ophthalmic solution Place 1 drop into both eyes every 4 (four) hours. 12/08/17   Orvan July, NP    Family History Family History  Problem Relation Age of Onset  . Heart Problems Mother     Social History Social History   Tobacco Use  . Smoking status: Current Some Day Smoker    Years: 5.00    Types: Cigars  . Smokeless tobacco: Former Systems developer    Types: Snuff  Substance Use Topics  . Alcohol use: Yes    Alcohol/week: 0.0 standard drinks    Comment: Occ wine  . Drug use: No     Allergies   Losartan and Penicillins   Review of Systems Review of Systems  Constitutional: Positive for activity change. Negative for fever.  HENT: Negative for congestion, ear pain, postnasal drip, rhinorrhea, sinus pressure, sinus pain, sneezing and sore throat.   Eyes: Positive for pain, discharge, redness, itching and visual disturbance.  Respiratory: Negative for cough and shortness of breath.   Skin: Negative for rash.  Physical Exam Triage Vital Signs ED Triage Vitals [12/08/17 1156]  Enc Vitals Group     BP 131/81     Pulse Rate 74     Resp 14     Temp 98 F (36.7 C)     Temp src      SpO2 100 %     Weight      Height      Head Circumference      Peak Flow      Pain Score      Pain Loc      Pain Edu?      Excl. in [place]?    No data found.  Updated Vital Signs BP 131/81   Pulse 74   Temp 98 F (36.7 C)   Resp 14   SpO2 100%   Visual Acuity Right Eye Distance: 20/70 without corrective lens Left Eye Distance: 20/50 without corrective lens Bilateral Distance: 20/50 without corrective lens  Right Eye Near:   Left Eye Near:    Bilateral Near:     Physical Exam    Constitutional: He is oriented to person, place, and time. He appears well-developed and well-nourished.  Very pleasant. Non toxic or ill appearing.     HENT:  Head: Normocephalic and atraumatic.  Nose: Nose normal.  Mouth/Throat: Oropharynx is clear and moist.  Eyes: Right eye exhibits discharge. Left eye exhibits discharge.  Bilateral severe scleral injection with lid swelling and obvious drainage.   Neck: Normal range of motion.  Pulmonary/Chest: Effort normal.  Musculoskeletal: Normal range of motion.  Neurological: He is alert and oriented to person, place, and time.  Skin: Skin is warm and dry.  Psychiatric: He has a normal mood and affect.  Nursing note and vitals reviewed.    UC Treatments / Results  Labs (all labs ordered are listed, but only abnormal results are displayed) Labs Reviewed - No data to display  EKG None  Radiology No results found.  Procedures Procedures (including critical care time)  Medications Ordered in UC Medications - No data to display  Initial Impression / Assessment and Plan / UC Course  I have reviewed the triage vital signs and the nursing notes.  Pertinent labs & imaging results that were available during my care of the patient were reviewed by me and considered in my medical decision making (see chart for details).     Bilateral bacterial conjunctivitis- Polytrim q 4 hours while awake.  Follow up as needed for continued or worsening symptoms  Final Clinical Impressions(s) / UC Diagnoses   Final diagnoses:  Acute bacterial conjunctivitis of both eyes     Discharge Instructions     It was nice meeting you!!  We will go ahead and treat you for infection in both eyes. You can also try Pataday allergy eyedrops.  This could help with your symptoms and for future issues.  Follow up as needed for continued or worsening symptoms     ED Prescriptions    Medication Sig Dispense Auth. Provider   trimethoprim-polymyxin b  (POLYTRIM) ophthalmic solution Place 1 drop into both eyes every 4 (four) hours. 10 mL Orvan July, NP     Controlled Substance Prescriptions [place] Controlled Substance Registry consulted? no   Orvan July, NP 12/08/17 1851

## 2017-12-08 NOTE — Telephone Encounter (Signed)
Received call from patient.   Reports that he has x1 week B eye irritation, swelling to B eyelids, and drainage of yellow mucus to B eyes.   Advised that OV will be required for ABTx. Will go to UC since there is no availability today.

## 2017-12-12 ENCOUNTER — Ambulatory Visit: Payer: 59 | Admitting: Physician Assistant

## 2017-12-12 ENCOUNTER — Encounter: Payer: Self-pay | Admitting: Physician Assistant

## 2017-12-12 ENCOUNTER — Encounter: Payer: Self-pay | Admitting: Family Medicine

## 2017-12-12 VITALS — BP 124/82 | HR 75 | Temp 98.4°F | Resp 18 | Ht 71.0 in | Wt 331.2 lb

## 2017-12-12 DIAGNOSIS — H1033 Unspecified acute conjunctivitis, bilateral: Secondary | ICD-10-CM

## 2017-12-12 MED ORDER — SULFACETAMIDE SODIUM 10 % OP SOLN: 2.0000 [drp] | Freq: Four times a day (QID) | OPHTHALMIC | 0 refills | Status: AC

## 2017-12-12 MED ORDER — OLOPATADINE HCL 0.2 % OP SOLN: 1.0000 [drp] | Freq: Every day | OPHTHALMIC | 0 refills | Status: DC

## 2017-12-12 NOTE — Progress Notes (Signed)
Patient ID: MATEEN FRANSSEN MRN: [id number], DOB: 1961-02-09, 57 y.o. Date of Encounter: 12/12/2017, 4:03 PM    Chief Complaint:  Chief Complaint  Patient presents with  . pink eye in right eye     HPI: 58 y.o. year old male presents with above.   I reviewed his urgent care note from 12/08/2017. That note reported that he was "having bilateral eye redness, swelling, drainage, itching, blurred vision x1 week.  Denied any additional symptoms." Was felt to have bilateral bacterial conjunctivitis.  Prescribed Polytrim ophthalmic solution.  Today patient reports that he has been using the drops prescribed by the urgent care but is having minimal relief, minimal improvement.   Says that "this has now been going on for 2 weeks and is very aggravating". "Has been on these drops for a week--thought it should be clearing up by now"   Today during visit: Asked what his symptoms were prior to starting these drops.  He states that his eyelids were swollen.  He reports that his eyes lashes were matted together in the mornings.  States that he definitely was having mucousy drainage.  Definitely crusting together in the mornings.  He reports that he has had no nasal congestion.  Has been no blowing no mucus from his nose.  Has been having no chest congestion or cough.  No fevers or chills.  Reports that he has been having no sneezing.  States that otherwise he feels perfectly fine other than just his eyes.      Home Meds:   Outpatient Medications Prior to Visit  Medication Sig Dispense Refill  . beta carotene w/minerals (OCUVITE) tablet Take 1 tablet by mouth daily.    . Cyanocobalamin (VITAMIN B 12 PO) Take by mouth.    . hydrochlorothiazide (HYDRODIURIL) 25 MG tablet TAKE 1 TABLET BY MOUTH ONCE DAILY 30 tablet 11  . saw palmetto 160 MG capsule Take 160 mg by mouth daily.     Marland Kitchen trimethoprim-polymyxin b (POLYTRIM) ophthalmic solution Place 1 drop into both eyes every 4 (four) hours. 10 mL 0   . furosemide (LASIX) 40 MG tablet TAKE ONE TABLET BY MOUTH ONCE DAILY (Patient not taking: Reported on 12/12/2017) 90 tablet 2  . KLOR-CON M20 20 MEQ tablet TAKE ONE TABLET BY MOUTH ONCE DAILY (Patient not taking: Reported on 12/12/2017) 90 tablet 3  . metFORMIN (GLUCOPHAGE) 500 MG tablet TAKE TWO TABLETS BY MOUTH TWICE DAILY WITH MEALS (Patient not taking: Reported on 12/12/2017) 360 tablet 1  . simvastatin (ZOCOR) 40 MG tablet Take 1 tablet (40 mg total) by mouth at bedtime. (Patient not taking: Reported on 12/12/2017) 90 tablet 1   No facility-administered medications prior to visit.     Allergies:  Allergies  Allergen Reactions  . Losartan Swelling  . Penicillins       Review of Systems: See HPI for pertinent ROS. All other ROS negative.    Physical Exam: Blood pressure 124/82, pulse 75, temperature 98.4 F (36.9 C), temperature source Oral, resp. rate 18, height 5\' 11"  (1.803 m), weight (!) 150.2 kg, SpO2 94 %., Body mass index is 46.19 kg/m. General:  Obese WM. Appears in no acute distress. HEENT: Normocephalic, atraumatic.  Diffuse conjunctival injection in both eyes, right slightly > left. Minimal swelling of eyelids at this time. Small amount of drainage present in corner of eye.   Bilateral auditory canals clear, TM's are without perforation, pearly grey and translucent with reflective cone of light bilaterally. Oral cavity moist, posterior  pharynx without exudate, erythema.  No rash present on face. No rash on nose, cheeks, eyelids.  Neck: Supple. No thyromegaly. No lymphadenopathy. Lungs: Clear bilaterally to auscultation without wheezes, rales, or rhonchi. Breathing is unlabored. Heart: Regular rhythm. No murmurs, rubs, or gallops. Msk:  Strength and tone normal for age. Extremities/Skin: Warm and dry.  Neuro: Alert and oriented X 3. Moves all extremities spontaneously. Gait is normal. CNII-XII grossly in tact. Psych:  Responds to questions appropriately with a normal  affect.     ASSESSMENT AND PLAN:  57 y.o. year old male with   1. Acute conjunctivitis of both eyes, unspecified acute conjunctivitis type Change to a different antibacterial drop.  Will also add drops for possible allergic conjunctivitis.  If this does not resolve after using these 2 treatments then will recommend follow-up with optometrist or ophthalmologist. - sulfacetamide (BLEPH-10) 10 % ophthalmic solution; Place 2 drops into the right eye 4 (four) times daily for 10 days.  Dispense: 5 mL; Refill: 0 - Olopatadine HCl (PATADAY) 0.2 % SOLN; Apply 1 drop to eye daily.  Dispense: 5 mL; Refill: 0   Signed, [address] [place], [place], [facility] 12/12/2017 4:03 PM

## 2017-12-15 ENCOUNTER — Encounter: Payer: Self-pay | Admitting: Physician Assistant

## 2017-12-15 ENCOUNTER — Telehealth: Payer: Self-pay

## 2017-12-15 NOTE — Telephone Encounter (Signed)
Note to [employer] is APPROVED.   Please type up note.       ----- Message -----  From: Sheral Flow, LPN  Sent: 12/19/1005  8:01 AM EDT  To: Orlena Sheldon, PA-C  Subject: FW: Visit Follow-Up Question                 ----- Message -----  From: Chaney Born  Sent: 12/15/2017  7:12 AM EDT  To: [facility] Clinical Pool  Subject: Visit Follow-Up Question               I was seen Friday, 9/13, for conjunctivitis. My condition has improved. 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 I am comfortable with resuming classes on Wednesday, 9/18. Can I please get an excuse for this date?  Thank you    Call placed to patient that letter is available

## 2017-12-31 ENCOUNTER — Encounter: Payer: Self-pay | Admitting: Family Medicine

## 2017-12-31 ENCOUNTER — Ambulatory Visit: Payer: 59 | Admitting: Family Medicine

## 2017-12-31 ENCOUNTER — Other Ambulatory Visit: Payer: Self-pay

## 2017-12-31 VITALS — BP 126/82 | HR 74 | Temp 98.8°F | Resp 14 | Ht 71.0 in | Wt 330.0 lb

## 2017-12-31 DIAGNOSIS — H109 Unspecified conjunctivitis: Secondary | ICD-10-CM | POA: Diagnosis not present

## 2017-12-31 DIAGNOSIS — J069 Acute upper respiratory infection, unspecified: Secondary | ICD-10-CM | POA: Diagnosis not present

## 2017-12-31 MED ORDER — CROMOLYN SODIUM 4 % OP SOLN: 1.0000 [drp] | Freq: Four times a day (QID) | OPHTHALMIC | 0 refills | Status: DC

## 2017-12-31 MED ORDER — ERYTHROMYCIN 5 MG/GM OP OINT: 1.0000 "application " | TOPICAL_OINTMENT | Freq: Every day | OPHTHALMIC | 0 refills | Status: DC

## 2017-12-31 NOTE — Progress Notes (Signed)
Patient ID: Eric Johnson, male    DOB: June 04, 1960, 57 y.o.   MRN: [id number]  PCP: [name], MD  Chief Complaint  Patient presents with  . Eye Irritation    x1 day- redness and irritation to B eyes, chunky matter in eyes this morning- worse in R eye    Subjective:   RODDRICK Johnson is a 57 y.o. male, presents to clinic with CC of several weeks of bilateral red eyes, not improving after visit to PCP and to UC.  States left eye initially was worse, but did improved after two antibiotics.  Right eye now is worse, severe itching and redness with some swelling.  Has tried  polytrim, a second antibiotic.  Two other providers had advised to use pataday, pt has not tried yet.  Pharmacist gave a different OTC allergy eye drop to use, helps a little.  He has mild irritation, no pain, no blurry vision.  Some morning crusting.  He denies purulent discharge, no pain with eye movement, no photophobia.   No prescription glasses, no contact lenses.  No URI sx right now.  Some hx of mild seasonal allergies  Patient Active Problem List   Diagnosis Date Noted  . Morbid obesity ([place]) 10/30/2012  . Diabetes mellitus   . Hypercholesteremia   . Hypertension    Prior to Admission medications   Medication Sig Start Date End Date Taking? Authorizing Provider  beta carotene w/minerals (OCUVITE) tablet Take 1 tablet by mouth daily.   Yes [provider]  Cyanocobalamin (VITAMIN B 12 PO) Take by mouth.   Yes [provider]  hydrochlorothiazide (HYDRODIURIL) 25 MG tablet TAKE 1 TABLET BY MOUTH ONCE DAILY 11/26/17  Yes [name], MD  saw palmetto 160 MG capsule Take 160 mg by mouth daily.    Yes [provider]    Allergies  Allergen Reactions  . Losartan Swelling  . Penicillins     Review of Systems  Constitutional: Negative.  Negative for chills, fatigue, fever and unexpected weight change.  HENT: Negative.   Allergic/Immunologic: Negative for  immunocompromised state.  10 Systems reviewed and are negative for acute change except as noted in the HPI.      Objective:    Vitals:   12/31/17 1232  BP: 126/82  Pulse: 74  Resp: 14  Temp: 98.8 F (37.1 C)  TempSrc: Oral  SpO2: 97%  Weight: (!) 330 lb (149.7 kg)  Height: 5\' 11"  (1.803 m)      Physical Exam  Constitutional: He appears well-developed and well-nourished. No distress.  HENT:  Head: Normocephalic and atraumatic. Head is without right periorbital erythema and without left periorbital erythema.  Nose: Mucosal edema and rhinorrhea present.  Mouth/Throat: Uvula is midline and mucous membranes are normal. Posterior oropharyngeal erythema present. No oropharyngeal exudate, posterior oropharyngeal edema or tonsillar abscesses. No tonsillar exudate.  Eyes: Pupils are equal, round, and reactive to light. EOM are normal. Lids are everted and swept, no foreign bodies found. Right eye exhibits no discharge and no exudate. Left eye exhibits no discharge and no exudate. Right conjunctiva is injected (moderately, diffusely). Right conjunctiva has no hemorrhage. Left conjunctiva is injected (mildly). Left conjunctiva has no hemorrhage. Right eye exhibits normal extraocular motion and no nystagmus. Left eye exhibits normal extraocular motion and no nystagmus. Right pupil is round and reactive. Left pupil is round and reactive. Pupils are equal.  Right upper and lower lid mildly edematous and erythematous  Neck: No  tracheal deviation present.  Cardiovascular: Normal rate and regular rhythm.  Pulmonary/Chest: Effort normal. No stridor. No respiratory distress.  Musculoskeletal: Normal range of motion.  Neurological: He is alert. He exhibits normal muscle tone. Coordination normal.  Skin: Skin is warm and dry. No rash noted. He is not diaphoretic.  Psychiatric: He has a normal mood and affect. His behavior is normal.  Nursing note and vitals reviewed.   VA:  R 20//25, L 20/20, Both  20/20      Assessment & Plan:      ICD-10-CM   1. Conjunctivitis of both eyes, unspecified conjunctivitis type H10.9   2. URI, acute J06.9     Conjunctivitis - suspect viral vs allergic, has not improved with two abx drops, has not tried allergy drops - Rx for cromolyn, looked up on goodrx for pt, given good rx card as well, encouraged to get, try with cold compresses, and can try other over the counter meds for sx, like antihistamines, steroid nasal spray  Return if any eye pain, purulent discharge.   Delsa Grana, PA-C 12/31/17 12:52 PM

## 2017-12-31 NOTE — Patient Instructions (Signed)
Viral Conjunctivitis, Adult Viral conjunctivitis is an inflammation of the clear membrane that covers the white part of your eye and the inner surface of your eyelid (conjunctiva). The inflammation is caused by a viral infection. The blood vessels in the conjunctiva become inflamed, causing the eye to become red or pink, and often itchy. Viral conjunctivitis can be easily passed from one person to another (is contagious). This condition is often called pink eye. What are the causes? This condition is caused by a virus. A virus is a type of contagious germ. It can be spread by touching objects that have been contaminated with the virus, such as doorknobs or towels. It can also be passed through droplets, such as from coughing or sneezing. What are the signs or symptoms? Symptoms of this condition include:  Eye redness.  Tearing or watery eyes.  Itchy and irritated eyes.  Burning feeling in the eyes.  Clear drainage from the eye.  Swollen eyelids.  A gritty feeling in the eye.  Light sensitivity.  This condition often occurs with other symptoms, such as a fever, nausea, or a rash. How is this diagnosed? This condition is diagnosed with a medical history and physical exam. If you have discharge from your eye, the discharge may be tested to rule out other causes of conjunctivitis. How is this treated? Viral conjunctivitis does not respond to medicines that kill bacteria (antibiotics). Treatment for viral conjunctivitis is directed at stopping a bacterial infection from developing in addition to the viral infection. Treatment also aims to relieve your symptoms, such as itching. This may be done with antihistamine drops or other eye medicines. Rarely, steroid eye drops or antiviral medicines may be prescribed. Follow these instructions at home: Medicines   Take or apply over-the-counter and prescription medicines only as told by your health care provider.  Be very careful to avoid  touching the edge of the eyelid with the eye drop bottle or ointment tube when applying medicines to the affected eye. Being careful this way will stop you from spreading the infection to the other eye or to other people. Eye care  Avoid touching or rubbing your eyes.  Apply a warm, wet, clean washcloth to your eye for 10-20 minutes, 3-4 times per day or as told by your health care provider.  If you wear contact lenses, do not wear them until the inflammation is gone and your health care provider says it is safe to wear them again. Ask your health care provider how to sterilize or replace your contact lenses before using them again. Wear glasses until you can resume wearing contacts.  Avoid wearing eye makeup until the inflammation is gone. Throw away any old eye cosmetics that may be contaminated.  Gently wipe away any drainage from your eye with a warm, wet washcloth or a cotton ball. General instructions  Change or wash your pillowcase every day or as told by your health care provider.  Do not share towels, pillowcases, washcloths, eye makeup, makeup brushes, contact lenses, or glasses. This may spread the infection.  Wash your hands often with soap and water. Use paper towels to dry your hands. If soap and water are not available, use hand sanitizer.  Try to avoid contact with other people for one week or as told by your health care provider. Contact a health care provider if:  Your symptoms do not improve with treatment or they get worse.  You have increased pain.  Your vision becomes blurry.  You have a   fever.  You have facial pain, redness, or swelling.  You have yellow or green drainage coming from your eye.  You have new symptoms. This information is not intended to replace advice given to you by your health care provider. Make sure you discuss any questions you have with your health care provider. Document Released: 06/08/2002 Document Revised: 10/14/2015 Document  Reviewed: 10/03/2015 Elsevier Interactive Patient Education  2018 [employer]  

## 2018-01-02 ENCOUNTER — Encounter: Payer: Self-pay | Admitting: Family Medicine

## 2018-02-18 ENCOUNTER — Other Ambulatory Visit: Payer: Self-pay | Admitting: Family Medicine

## 2018-02-18 MED ORDER — ZOSTER VAC RECOMB ADJUVANTED 50 MCG/0.5ML IM SUSR: 0.5000 mL | Freq: Once | INTRAMUSCULAR | 1 refills | Status: AC

## 2018-02-19 ENCOUNTER — Ambulatory Visit (INDEPENDENT_AMBULATORY_CARE_PROVIDER_SITE_OTHER): Payer: 59 | Admitting: Family Medicine

## 2018-02-19 ENCOUNTER — Encounter: Payer: Self-pay | Admitting: Family Medicine

## 2018-02-19 VITALS — BP 126/72 | HR 86 | Temp 99.3°F | Resp 20 | Ht 71.0 in | Wt 343.0 lb

## 2018-02-19 DIAGNOSIS — Z125 Encounter for screening for malignant neoplasm of prostate: Secondary | ICD-10-CM | POA: Diagnosis not present

## 2018-02-19 DIAGNOSIS — Z0000 Encounter for general adult medical examination without abnormal findings: Secondary | ICD-10-CM

## 2018-02-19 DIAGNOSIS — I10 Essential (primary) hypertension: Secondary | ICD-10-CM | POA: Diagnosis not present

## 2018-02-19 DIAGNOSIS — E119 Type 2 diabetes mellitus without complications: Secondary | ICD-10-CM | POA: Diagnosis not present

## 2018-02-19 DIAGNOSIS — E7800 Pure hypercholesterolemia, unspecified: Secondary | ICD-10-CM

## 2018-02-19 NOTE — Progress Notes (Signed)
Subjective:    Patient ID: Eric Johnson, male    DOB: 01-04-1961, 57 y.o.   MRN: [id number]  HPI Unfortunately, since his last visit, patient has gained >40 lbs. patient denies any symptoms of uncontrolled diabetes.  He denies any polyuria, polydipsia, or blurry vision.  He denies any neuropathy in his feet.  He is due for diabetic eye exam and I encouraged him to do that.  Unfortunately he continues to smoke cigars.  I have encouraged him to quit smoking.  Patient's physical exam is significant for a neck circumference greater than 18 inches.  He wears a 20 size collar.  He also has a short mandible.  He is at high risk for obstructive sleep apnea based on body habitus.  However he denies hypersomnolence.  He denies snoring.  He denies any apneic episodes at night.  We have discussed possibly referral for a sleep study and at the present time he declines this.  He has had his flu shot at work.  My nurses sent the prescription for Shingrix to his pharmacy.  Colonoscopy was in 2015 and is not due again until 2020 due to a history of colon polyps. Patient is due for diabetic eye exam. He declines HIV or hepatitis C screening. Immunizations are up-to-date: Immunization History  Administered Date(s) Administered  . Influenza Split 04/21/2009  . Influenza,inj,Quad PF,6+ Mos 12/25/2012, 01/03/2017  . Influenza-Unspecified 01/05/2016, 01/02/2018  . Pneumococcal Polysaccharide-23 06/13/2015  . Tdap 04/21/2009  . Zoster 10/30/2012    Past Medical History:  Diagnosis Date  . Bronchitis   . Diabetes mellitus   . Hypercholesteremia   . Hypertension   . Morbid obesity ([place]) 10/30/2012   Past Surgical History:  Procedure Laterality Date  . COLONOSCOPY N/A 08/27/2013   Procedure: COLONOSCOPY;  Surgeon: Daneil Dolin, MD;  Location: [facility];  Service: Endoscopy;  Laterality: N/A;  11:30 AM  . COLONOSCOPY W/ POLYPECTOMY  2000  . kidney stone removal     Current Outpatient Medications on File  Prior to Visit  Medication Sig Dispense Refill  . aspirin 81 MG chewable tablet Chew 81 mg by mouth daily.    . beta carotene w/minerals (OCUVITE) tablet Take 1 tablet by mouth daily.    . Cyanocobalamin (VITAMIN B 12 PO) Take by mouth.    . hydrochlorothiazide (HYDRODIURIL) 25 MG tablet TAKE 1 TABLET BY MOUTH ONCE DAILY 30 tablet 11  . Omega-3 Fatty Acids (FISH OIL) 1200 MG CPDR Take by mouth.    . saw palmetto 160 MG capsule Take 160 mg by mouth daily.      No current facility-administered medications on file prior to visit.    Allergies  Allergen Reactions  . Losartan Swelling  . Penicillins    Social History   Socioeconomic History  . Marital status: Married    Spouse name: Not on file  . Number of children: Not on file  . Years of education: Not on file  . Highest education level: Not on file  Occupational History  . Not on file  Social Needs  . Financial resource strain: Not on file  . Food insecurity:    Worry: Not on file    Inability: Not on file  . Transportation needs:    Medical: Not on file    Non-medical: Not on file  Tobacco Use  . Smoking status: Current Some Day Smoker    Years: 5.00    Types: Cigars  . Smokeless tobacco: Former Systems developer  Types: Snuff  Substance and Sexual Activity  . Alcohol use: Yes    Alcohol/week: 0.0 standard drinks    Comment: Occ wine  . Drug use: No  . Sexual activity: Not on file  Lifestyle  . Physical activity:    Days per week: Not on file    Minutes per session: Not on file  . Stress: Not on file  Relationships  . Social connections:    Talks on phone: Not on file    Gets together: Not on file    Attends religious service: Not on file    Active member of club or organization: Not on file    Attends meetings of clubs or organizations: Not on file    Relationship status: Not on file  . Intimate partner violence:    Fear of current or ex partner: Not on file    Emotionally abused: Not on file    Physically abused:  Not on file    Forced sexual activity: Not on file  Other Topics Concern  . Not on file  Social History Narrative  . Not on file   Family History  Problem Relation Age of Onset  . Heart Problems Mother       Review of Systems  All other systems reviewed and are negative.      Objective:   Physical Exam  Constitutional: He is oriented to person, place, and time. He appears well-developed and well-nourished. No distress.  HENT:  Head: Normocephalic and atraumatic.  Right Ear: External ear normal.  Left Ear: External ear normal.  Nose: Nose normal.  Mouth/Throat: Oropharynx is clear and moist. No oropharyngeal exudate.  Eyes: Pupils are equal, round, and reactive to light. Conjunctivae and EOM are normal. Right eye exhibits no discharge. Left eye exhibits no discharge. No scleral icterus.  Neck: Normal range of motion. Neck supple. No JVD present. No tracheal deviation present. No thyromegaly present.  Cardiovascular: Normal rate, regular rhythm, normal heart sounds and intact distal pulses. Exam reveals no gallop and no friction rub.  No murmur heard. Pulmonary/Chest: Effort normal and breath sounds normal. No stridor. No respiratory distress. He has no wheezes. He has no rales. He exhibits no tenderness.  Abdominal: Soft. Bowel sounds are normal. He exhibits no distension and no mass. There is no tenderness. There is no rebound and no guarding.  Musculoskeletal: Normal range of motion. He exhibits edema. He exhibits no tenderness or deformity.  Lymphadenopathy:    He has no cervical adenopathy.  Neurological: He is alert and oriented to person, place, and time. He has normal reflexes. No cranial nerve deficit. He exhibits normal muscle tone. Coordination normal.  Skin: He is not diaphoretic.  Psychiatric: He has a normal mood and affect. His behavior is normal. Judgment and thought content normal.  Vitals reviewed.        Assessment & Plan:  General medical  exam  Controlled type 2 diabetes mellitus without complication, without long-term current use of insulin ([place]) - Plan: CBC with Differential/Platelet, COMPLETE METABOLIC PANEL WITH GFR, Lipid panel, Microalbumin, urine, Hemoglobin A1c  Prostate cancer screening - Plan: PSA  Benign essential HTN  Hypercholesteremia  Morbid obesity ([place])  Physical exam is significant for elevated BMI, large neck circumference, short mandible.  I have encouraged screening for sleep apnea.  Patient politely declines this at the present time.  We discussed HIV and hepatitis C screening.  Patient has low risk factors and therefore elects not to have his blood work drawn.  I will screen for prostate cancer with a PSA.  Colonoscopy is up-to-date.  Given his recent weight gain, will check his diabetes test including hemoglobin A1c, urine microalbumin, CBC, CMP, and a fasting lipid panel.  Encourage the patient to work on diet and exercise and weight loss.  He was successfully able to do this before and I believe he can do it again.  I also encourage smoking cessation.

## 2018-02-20 LAB — CBC WITH DIFFERENTIAL/PLATELET
Basophils Absolute: 48 cells/uL (ref 0–200)
Basophils Relative: 1.1 %
Eosinophils Absolute: 101 cells/uL (ref 15–500)
Eosinophils Relative: 2.3 %
HCT: 45.5 % (ref 38.5–50.0)
Hemoglobin: 16 g/dL (ref 13.2–17.1)
Lymphs Abs: 1113 cells/uL (ref 850–3900)
MCH: 33.8 pg — ABNORMAL HIGH (ref 27.0–33.0)
MCHC: 35.2 g/dL (ref 32.0–36.0)
MCV: 96 fL (ref 80.0–100.0)
MPV: 10.7 fL (ref 7.5–12.5)
Monocytes Relative: 12.6 %
Neutro Abs: 2583 cells/uL (ref 1500–7800)
Neutrophils Relative %: 58.7 %
Platelets: 221 10*3/uL (ref 140–400)
RBC: 4.74 10*6/uL (ref 4.20–5.80)
RDW: 12.7 % (ref 11.0–15.0)
Total Lymphocyte: 25.3 %
WBC mixed population: 554 cells/uL (ref 200–950)
WBC: 4.4 10*3/uL (ref 3.8–10.8)

## 2018-02-20 LAB — LIPID PANEL
Cholesterol: 206 mg/dL — ABNORMAL HIGH (ref <200)
HDL: 38 mg/dL — ABNORMAL LOW (ref >40)
LDL Cholesterol (Calc): 140 mg/dL (calc) — ABNORMAL HIGH
Non-HDL Cholesterol (Calc): 168 mg/dL (calc) — ABNORMAL HIGH (ref <130)
Total CHOL/HDL Ratio: 5.4 (calc) — ABNORMAL HIGH (ref <5.0)
Triglycerides: 146 mg/dL (ref <150)

## 2018-02-20 LAB — HEMOGLOBIN A1C
Hgb A1c MFr Bld: 6.9 % of total Hgb — ABNORMAL HIGH (ref <5.7)
Mean Plasma Glucose: 151 (calc)
eAG (mmol/L): 8.4 (calc)

## 2018-02-20 LAB — COMPLETE METABOLIC PANEL WITH GFR
AG Ratio: 1.6 (calc) (ref 1.0–2.5)
ALT: 27 U/L (ref 9–46)
AST: 20 U/L (ref 10–35)
Albumin: 4 g/dL (ref 3.6–5.1)
Alkaline phosphatase (APISO): 62 U/L (ref 40–115)
BUN: 17 mg/dL (ref 7–25)
CO2: 28 mmol/L (ref 20–32)
Calcium: 8.7 mg/dL (ref 8.6–10.3)
Chloride: 105 mmol/L (ref 98–110)
Creat: 0.93 mg/dL (ref 0.70–1.33)
GFR, Est African American: 105 mL/min/{1.73_m2} (ref >=60)
GFR, Est Non African American: 90 mL/min/{1.73_m2} (ref >=60)
Globulin: 2.5 g/dL (calc) (ref 1.9–3.7)
Glucose, Bld: 150 mg/dL — ABNORMAL HIGH (ref 65–99)
Potassium: 4.3 mmol/L (ref 3.5–5.3)
Sodium: 141 mmol/L (ref 135–146)
Total Bilirubin: 0.4 mg/dL (ref 0.2–1.2)
Total Protein: 6.5 g/dL (ref 6.1–8.1)

## 2018-02-20 LAB — MICROALBUMIN, URINE: Microalb, Ur: 0.5 mg/dL

## 2018-02-20 LAB — PSA: PSA: 0.8 ng/mL (ref <=4.0)

## 2018-02-23 MED ORDER — METFORMIN HCL ER 500 MG PO TB24: 1000.0000 mg | ORAL_TABLET | Freq: Every day | ORAL | 1 refills | Status: DC

## 2018-02-23 MED ORDER — ATORVASTATIN CALCIUM 40 MG PO TABS: 40.0000 mg | ORAL_TABLET | Freq: Every day | ORAL | 1 refills | Status: DC

## 2018-03-11 ENCOUNTER — Encounter: Payer: Self-pay | Admitting: Family Medicine

## 2018-03-11 ENCOUNTER — Ambulatory Visit: Payer: 59 | Admitting: Family Medicine

## 2018-03-11 VITALS — BP 120/76 | HR 77 | Temp 98.3°F | Resp 16 | Wt 339.0 lb

## 2018-03-11 DIAGNOSIS — J069 Acute upper respiratory infection, unspecified: Secondary | ICD-10-CM

## 2018-03-11 MED ORDER — HYDROCODONE-HOMATROPINE 5-1.5 MG/5ML PO SYRP: 5.0000 mL | ORAL_SOLUTION | Freq: Three times a day (TID) | ORAL | 0 refills | Status: DC | PRN

## 2018-03-11 MED ORDER — BENZONATATE 100 MG PO CAPS: 100.0000 mg | ORAL_CAPSULE | Freq: Three times a day (TID) | ORAL | 0 refills | Status: DC | PRN

## 2018-03-11 MED ORDER — AZITHROMYCIN 250 MG PO TABS: ORAL_TABLET | ORAL | 0 refills | Status: DC

## 2018-03-11 MED ORDER — PREDNISONE 20 MG PO TABS: 40.0000 mg | ORAL_TABLET | Freq: Every day | ORAL | 0 refills | Status: AC

## 2018-03-11 MED ORDER — ALBUTEROL SULFATE HFA 108 (90 BASE) MCG/ACT IN AERS: 2.0000 | INHALATION_SPRAY | RESPIRATORY_TRACT | 0 refills | Status: DC | PRN

## 2018-03-11 NOTE — Patient Instructions (Signed)
I suspect you have a viral illness in upper airways.  The treatment is supportive with fluids, tylenol, ibuprofen, cough suppressants, over the counter antihistamines.  It hast to run its course and usually it is self-limiting, meaning it will go away on its own.  Antibiotics do not kill viruses.  Illnesses can last from 1 to 2 days to 1-3 weeks.  See handout below.   I have sent a cough medicine through the the pharmacy.  I have printed meds for treatment of bronchitis incase you feel it go to your chest with your history of bronchitis.  Upper Respiratory Infection, Adult Most upper respiratory infections (URIs) are caused by a virus. A URI affects the nose, throat, and upper air passages. The most common type of URI is often called "the common cold." Follow these instructions at home:  Take medicines only as told by your doctor.  Gargle warm saltwater or take cough drops to comfort your throat as told by your doctor.  Use a warm mist humidifier or inhale steam from a shower to increase air moisture. This may make it easier to breathe.  Drink enough fluid to keep your pee (urine) clear or pale yellow.  Eat soups and other clear broths.  Have a healthy diet.  Rest as needed.  Go back to work when your fever is gone or your doctor says it is okay. ? You may need to stay home longer to avoid giving your URI to others. ? You can also wear a face mask and wash your hands often to prevent spread of the virus.  Use your inhaler more if you have asthma.  Do not use any tobacco products, including cigarettes, chewing tobacco, or electronic cigarettes. If you need help quitting, ask your doctor. Contact a doctor if:  You are getting worse, not better.  Your symptoms are not helped by medicine.  You have chills.  You are getting more short of breath.  You have brown or red mucus.  You have yellow or brown discharge from your nose.  You have pain in your face, especially when you  bend forward.  You have a fever.  You have puffy (swollen) neck glands.  You have pain while swallowing.  You have white areas in the back of your throat. Get help right away if:  You have very bad or constant: ? Headache. ? Ear pain. ? Pain in your forehead, behind your eyes, and over your cheekbones (sinus pain). ? Chest pain.  You have long-lasting (chronic) lung disease and any of the following: ? Wheezing. ? Long-lasting cough. ? Coughing up blood. ? A change in your usual mucus.  You have a stiff neck.  You have changes in your: ? Vision. ? Hearing. ? Thinking. ? Mood. This information is not intended to replace advice given to you by your health care provider. Make sure you discuss any questions you have with your health care provider. Document Released: 09/04/2007 Document Revised: 11/19/2015 Document Reviewed: 06/23/2013 Elsevier Interactive Patient Education  2018 [employer].

## 2018-03-11 NOTE — Progress Notes (Signed)
Patient ID: Eric Johnson, male    DOB: 1960/06/24, 57 y.o.   MRN: [id number]  PCP: [name], MD  Chief Complaint  Patient presents with  . Cough    Patient has scratchy throat, produtive cough, and fever    Subjective:   Eric Johnson is a 57 y.o. male, presents to clinic with CC of 1 week of cough that is been nagging and productive with clear to yellow sputum, worse at night and nagging and persistent throughout the day.  His cough is been mostly unchanged for the last week, initially did start with some cold-like symptoms postnasal drip and scratchy throat most of that has resolved except for his throat continues to be irritated.  He began to have body aches and feel slightly ill about 3 days ago although he denies fever, sweats, chills.  He does have a history of bronchitis, he does not feel wheezy like he has in the past with bronchitis.  He is a current smoker and does continue to smoke.   Patient Active Problem List   Diagnosis Date Noted  . Morbid obesity ([place]) 10/30/2012  . Diabetes mellitus   . Hypercholesteremia   . Hypertension      Prior to Admission medications   Medication Sig Start Date End Date Taking? Authorizing Provider  aspirin 81 MG chewable tablet Chew 81 mg by mouth daily.   Yes [provider]  atorvastatin (LIPITOR) 40 MG tablet Take 1 tablet (40 mg total) by mouth daily. 02/23/18  Yes [name], MD  beta carotene w/minerals (OCUVITE) tablet Take 1 tablet by mouth daily.   Yes [provider]  Cyanocobalamin (VITAMIN B 12 PO) Take by mouth.   Yes [provider]  hydrochlorothiazide (HYDRODIURIL) 25 MG tablet TAKE 1 TABLET BY MOUTH ONCE DAILY 11/26/17  Yes [name], MD  metFORMIN (GLUCOPHAGE XR) 500 MG 24 hr tablet Take 2 tablets (1,000 mg total) by mouth daily with breakfast. 02/23/18  Yes [name], MD  Omega-3 Fatty Acids (FISH OIL) 1200 MG CPDR Take by mouth.   Yes [provider]    saw palmetto 160 MG capsule Take 160 mg by mouth daily.    Yes [provider]     Allergies  Allergen Reactions  . Losartan Swelling  . Penicillins      Family History  Problem Relation Age of Onset  . Heart Problems Mother      Social History   Socioeconomic History  . Marital status: Married    Spouse name: Not on file  . Number of children: Not on file  . Years of education: Not on file  . Highest education level: Not on file  Occupational History  . Not on file  Social Needs  . Financial resource strain: Not on file  . Food insecurity:    Worry: Not on file    Inability: Not on file  . Transportation needs:    Medical: Not on file    Non-medical: Not on file  Tobacco Use  . Smoking status: Current Some Day Smoker    Years: 5.00    Types: Cigars  . Smokeless tobacco: Former Systems developer    Types: Snuff  Substance and Sexual Activity  . Alcohol use: Yes    Alcohol/week: 0.0 standard drinks    Comment: Occ wine  . Drug use: No  . Sexual activity: Not on file  Lifestyle  . Physical activity:  Days per week: Not on file    Minutes per session: Not on file  . Stress: Not on file  Relationships  . Social connections:    Talks on phone: Not on file    Gets together: Not on file    Attends religious service: Not on file    Active member of club or organization: Not on file    Attends meetings of clubs or organizations: Not on file    Relationship status: Not on file  . Intimate partner violence:    Fear of current or ex partner: Not on file    Emotionally abused: Not on file    Physically abused: Not on file    Forced sexual activity: Not on file  Other Topics Concern  . Not on file  Social History Narrative  . Not on file     Review of Systems  Constitutional: Negative.   HENT: Negative.   Eyes: Negative.   Respiratory: Negative.   Cardiovascular: Negative.   Gastrointestinal: Negative.   Endocrine: Negative.   Genitourinary: Negative.    Musculoskeletal: Negative.   Skin: Negative.   Allergic/Immunologic: Negative.   Neurological: Negative.   Hematological: Negative.   Psychiatric/Behavioral: Negative.   All other systems reviewed and are negative.      Objective:    Vitals:   03/11/18 1417  BP: 120/76  Pulse: 77  Resp: 16  Temp: 98.3 F (36.8 C)  TempSrc: Oral  SpO2: 97%  Weight: (!) 339 lb (153.8 kg)      Physical Exam Vitals signs and nursing note reviewed.  Constitutional:      General: He is not in acute distress.    Appearance: Normal appearance. He is well-developed. He is obese. He is ill-appearing (mildly ill). He is not toxic-appearing or diaphoretic.  HENT:     Head: Normocephalic and atraumatic.     Jaw: No trismus.     Right Ear: Tympanic membrane, ear canal and external ear normal.     Left Ear: Tympanic membrane, ear canal and external ear normal.     Nose: Mucosal edema, congestion and rhinorrhea present.     Right Sinus: No maxillary sinus tenderness or frontal sinus tenderness.     Left Sinus: No maxillary sinus tenderness or frontal sinus tenderness.     Comments: Moderate clear nasal discharge, nasal mucosa erythematous    Mouth/Throat:     Mouth: Mucous membranes are moist. Mucous membranes are not pale, not dry and not cyanotic.     Pharynx: Uvula midline. Posterior oropharyngeal erythema present. No oropharyngeal exudate or uvula swelling.     Tonsils: No tonsillar exudate or tonsillar abscesses.  Eyes:     General: Lids are normal. No scleral icterus.    Conjunctiva/sclera: Conjunctivae normal.     Pupils: Pupils are equal, round, and reactive to light.     Comments: Eyelids mildly irritated and erythematous appearing with some tearing, no purulence, no conjunctival injection  Neck:     Musculoskeletal: Normal range of motion and neck supple.     Trachea: Trachea and phonation normal. No tracheal deviation.  Cardiovascular:     Rate and Rhythm: Normal rate and regular  rhythm.     Pulses: Normal pulses.          Radial pulses are 2+ on the right side and 2+ on the left side.     Heart sounds: Normal heart sounds. No murmur. No friction rub. No gallop.   Pulmonary:  Effort: Pulmonary effort is normal. No tachypnea, accessory muscle usage or respiratory distress.     Breath sounds: Normal breath sounds. No stridor. No decreased breath sounds, wheezing, rhonchi or rales.  Abdominal:     General: Bowel sounds are normal. There is no distension.     Palpations: Abdomen is soft.     Tenderness: There is no abdominal tenderness.  Musculoskeletal: Normal range of motion.  Skin:    General: Skin is warm and dry.     Capillary Refill: Capillary refill takes less than 2 seconds.     Coloration: Skin is not pale.     Findings: No rash.     Nails: There is no clubbing.   Neurological:     Mental Status: He is alert and oriented to person, place, and time.     Motor: No abnormal muscle tone.     Coordination: Coordination normal.     Gait: Gait normal.  Psychiatric:        Mood and Affect: Mood normal.        Speech: Speech normal.        Behavior: Behavior normal. Behavior is cooperative.        Thought Content: Thought content normal.           Assessment & Plan:   57 y/o male with URI sx for 1 week, symptoms have started to improve but he continues to have a nagging cough without any wheeze chest pain or shortness of breath, afebrile.  He does have a history of bronchitis and is current smoker, on exam he does appear to have a viral upper respiratory infection, lungs are clear, do not believe it is quite developed bronchitis yet.  Watchful waiting I have discussed with him, and currently do not see a indication for treatment with antibiotics for a bacterial illness.    ICD-10-CM   1. Upper respiratory tract infection, unspecified type J06.9     Given his history and the fact that he is a current smoker- Zpak, prednisone and albuterol inhaler  printed for pt in case he develops bronchitis, which he has been getting more frequently in recent years, he does continue to smoke as well.  Pt understands that he can start these with chest tightness, wheeze or worsening cough with shortness of breath.  Any severe SOB or CP needs to be reevaluated.  Advised to stay out of work for 1-2 days until feeling better, wash hands frequently, rest, push fluids -supportive and symptomatic treatment for viral illness which should be self-limiting.  He does not improve he was encouraged to follow-up  Delsa Grana, PA-C 03/11/18 2:25 PM

## 2018-06-15 ENCOUNTER — Encounter: Payer: Self-pay | Admitting: Family Medicine

## 2018-08-11 ENCOUNTER — Encounter: Payer: Self-pay | Admitting: Family Medicine

## 2018-08-11 ENCOUNTER — Encounter: Payer: Self-pay | Admitting: Internal Medicine

## 2018-08-14 ENCOUNTER — Other Ambulatory Visit: Payer: Self-pay | Admitting: Family Medicine

## 2018-09-07 ENCOUNTER — Encounter: Payer: Self-pay | Admitting: Family Medicine

## 2018-09-09 ENCOUNTER — Telehealth: Payer: Self-pay | Admitting: *Deleted

## 2018-09-09 NOTE — Telephone Encounter (Signed)
Of note, extended release MTF has been voluntarily recalled d/t [place] contamination.

## 2018-09-09 NOTE — Telephone Encounter (Signed)
Received fax requesting alternative to Metformin 500mg  XR as medication is on backorder.   MD please advise.

## 2018-09-10 MED ORDER — METFORMIN HCL 500 MG PO TABS: 1000.0000 mg | ORAL_TABLET | Freq: Two times a day (BID) | ORAL | 3 refills | Status: DC

## 2018-09-10 NOTE — Telephone Encounter (Signed)
Switch to metformin 500 bid

## 2018-09-10 NOTE — Telephone Encounter (Signed)
Prescription sent to pharmacy.   Call placed to patient. LMTRC.  

## 2018-09-10 NOTE — Telephone Encounter (Signed)
Patient returned call and made aware.

## 2018-09-21 ENCOUNTER — Encounter: Payer: Self-pay | Admitting: Family Medicine

## 2018-09-21 ENCOUNTER — Ambulatory Visit: Payer: 59 | Admitting: Family Medicine

## 2018-09-21 ENCOUNTER — Other Ambulatory Visit: Payer: Self-pay

## 2018-09-21 VITALS — BP 134/86 | HR 88 | Temp 99.4°F | Resp 18 | Ht 71.0 in | Wt 334.0 lb

## 2018-09-21 DIAGNOSIS — H6021 Malignant otitis externa, right ear: Secondary | ICD-10-CM

## 2018-09-21 MED ORDER — DOXYCYCLINE HYCLATE 100 MG PO TABS: 100.0000 mg | ORAL_TABLET | Freq: Two times a day (BID) | ORAL | 0 refills | Status: DC

## 2018-09-21 NOTE — Progress Notes (Signed)
Subjective:    Patient ID: Eric Johnson, male    DOB: 10/31/1960, 58 y.o.   MRN: [id number]  HPI Patient presents with a one-week history of severe pain in his right ear.  He denies any symptoms of an upper respiratory infection.  He denies any runny nose, cough, sinus pressure, postnasal drip, sore throat.  He has not been swimming recently.  However it hurts to lay on his right ear.  Examination of the right auditory canal reveals a diffusely swollen auditory canal with erythema and exudate throughout the canal.  The tympanic membrane itself is dull and erythematous and retracted suggesting internal spread.  Patient has a history of penicillin allergy.  He does not recall what the reaction was Past Medical History:  Diagnosis Date  . Bronchitis   . Diabetes mellitus   . Hypercholesteremia   . Hypertension   . Morbid obesity ([place]) 10/30/2012   Past Surgical History:  Procedure Laterality Date  . COLONOSCOPY N/A 08/27/2013   Procedure: COLONOSCOPY;  Surgeon: Daneil Dolin, MD;  Location: [facility];  Service: Endoscopy;  Laterality: N/A;  11:30 AM  . COLONOSCOPY W/ POLYPECTOMY  2000  . kidney stone removal     Current Outpatient Medications on File Prior to Visit  Medication Sig Dispense Refill  . albuterol (PROVENTIL HFA;VENTOLIN HFA) 108 (90 Base) MCG/ACT inhaler Inhale 2 puffs into the lungs every 4 (four) hours as needed for wheezing or shortness of breath. 1 Inhaler 0  . aspirin 81 MG chewable tablet Chew 81 mg by mouth daily.    Marland Kitchen atorvastatin (LIPITOR) 40 MG tablet Take 1 tablet by mouth once daily 90 tablet 0  . beta carotene w/minerals (OCUVITE) tablet Take 1 tablet by mouth daily.    . Cyanocobalamin (VITAMIN B 12 PO) Take by mouth.    . hydrochlorothiazide (HYDRODIURIL) 25 MG tablet TAKE 1 TABLET BY MOUTH ONCE DAILY 30 tablet 11  . metFORMIN (GLUCOPHAGE) 500 MG tablet Take 2 tablets (1,000 mg total) by mouth 2 (two) times daily with a meal. 120 tablet 3  . Omega-3 Fatty  Acids (FISH OIL) 1200 MG CPDR Take by mouth.    . saw palmetto 160 MG capsule Take 160 mg by mouth daily.      No current facility-administered medications on file prior to visit.    Allergies  Allergen Reactions  . Losartan Swelling  . Penicillins    Social History   Socioeconomic History  . Marital status: Married    Spouse name: Not on file  . Number of children: Not on file  . Years of education: Not on file  . Highest education level: Not on file  Occupational History  . Not on file  Social Needs  . Financial resource strain: Not on file  . Food insecurity    Worry: Not on file    Inability: Not on file  . Transportation needs    Medical: Not on file    Non-medical: Not on file  Tobacco Use  . Smoking status: Current Some Day Smoker    Years: 5.00    Types: Cigars  . Smokeless tobacco: Former Systems developer    Types: Snuff  Substance and Sexual Activity  . Alcohol use: Yes    Alcohol/week: 0.0 standard drinks    Comment: Occ wine  . Drug use: No  . Sexual activity: Not on file  Lifestyle  . Physical activity    Days per week: Not on file  Minutes per session: Not on file  . Stress: Not on file  Relationships  . Social Herbalist on phone: Not on file    Gets together: Not on file    Attends religious service: Not on file    Active member of club or organization: Not on file    Attends meetings of clubs or organizations: Not on file    Relationship status: Not on file  . Intimate partner violence    Fear of current or ex partner: Not on file    Emotionally abused: Not on file    Physically abused: Not on file    Forced sexual activity: Not on file  Other Topics Concern  . Not on file  Social History Narrative  . Not on file      Review of Systems  All other systems reviewed and are negative.      Objective:   Physical Exam Vitals signs reviewed.  Constitutional:      General: He is not in acute distress.    Appearance: He is obese. He  is not ill-appearing or toxic-appearing.  HENT:     Right Ear: Drainage, swelling and tenderness present. Tympanic membrane is injected. Tympanic membrane is not scarred or perforated.     Left Ear: Tympanic membrane and ear canal normal.     Nose: Nose normal. No congestion or rhinorrhea.  Cardiovascular:     Rate and Rhythm: Normal rate and regular rhythm.  Pulmonary:     Effort: Pulmonary effort is normal.     Breath sounds: Normal breath sounds.  Neurological:     Mental Status: He is alert.           Assessment & Plan:  1. Acute malignant otitis externa of right ear Given his allergy, I will treat him with doxycycline 100 mg p.o. twice daily for 10 days.  If not improving after the next 4 to 5 days, I will switch the patient cautiously to Omnicef 300 mg p.o. twice daily.  He will call me back at the end of this week and let me know if his symptoms are not improving.  Given the fact that there appears to be otitis media due to internal spread I do not feel that topical drops will be sufficient

## 2018-11-11 ENCOUNTER — Other Ambulatory Visit: Payer: Self-pay | Admitting: Family Medicine

## 2018-11-11 DIAGNOSIS — I10 Essential (primary) hypertension: Secondary | ICD-10-CM

## 2018-12-14 ENCOUNTER — Telehealth: Payer: Self-pay | Admitting: Family Medicine

## 2018-12-14 NOTE — Telephone Encounter (Signed)
Patient would like to know if he can get something called in for pink eye  [phone]

## 2018-12-15 ENCOUNTER — Other Ambulatory Visit: Payer: Self-pay

## 2018-12-15 ENCOUNTER — Encounter: Payer: Self-pay | Admitting: Family Medicine

## 2018-12-15 ENCOUNTER — Ambulatory Visit: Payer: 59 | Admitting: Family Medicine

## 2018-12-15 VITALS — BP 130/82 | HR 82 | Temp 98.9°F | Resp 12 | Ht 71.0 in | Wt 337.0 lb

## 2018-12-15 DIAGNOSIS — H1033 Unspecified acute conjunctivitis, bilateral: Secondary | ICD-10-CM

## 2018-12-15 MED ORDER — ERYTHROMYCIN 5 MG/GM OP OINT: 1.0000 "application " | TOPICAL_OINTMENT | Freq: Two times a day (BID) | OPHTHALMIC | 0 refills | Status: DC

## 2018-12-15 NOTE — Progress Notes (Signed)
   Subjective:    Patient ID: Eric Johnson, male    DOB: [birth year], 58 y.o.   MRN: [id number]  Patient presents for B Eye Irritation (x4 days- redness, irritation, burning, crusts to B eyes in AM- L>R)  Patient here with bilateral eye irritation draining and redness.  He was diagnosed with conjunctivitis multiple times last fall.  States that he gets this around the same time each year.  He has had some matting in the eyes.  Denies any sinus congestion or drainage she does not wear, epistaxis.  In the past he tried Pataday as well over-the-counter and cromolyn eyedrops this did not work.  He was given Bleph-10 this did not work but erythromycin ointment did finally clear up his eyes.  He does have some mild blurry vision but This up to both going to school and working full-time.  Actually this past week because of his eyes he has not been able to concentrate as well on his school duties and needs a note to cover for this.  He has been working.  Does follow with my eye doctor in [place].  He does have underlying diabetes he is scheduling with PCP for complete physical and fasting labs Review Of Systems:  GEN- denies fatigue, fever, weight loss,weakness, recent illness HEENT- denies eye drainage, change in vision, nasal discharge, CVS- denies chest pain, palpitations RESP- denies SOB, cough, wheeze ABD- denies N/V, change in stools, abd pain GU- denies dysuria, hematuria, dribbling, incontinence MSK- denies joint pain, muscle aches, injury Neuro- denies headache, dizziness, syncope, seizure activity       Objective:    BP 130/82   Pulse 82   Temp 98.9 F (37.2 C) (Oral)   Resp 12   Ht 5\' 11"  (1.803 m)   Wt (!) 337 lb (152.9 kg)   SpO2 97%   BMI 47.00 kg/m  GEN- NAD, alert and oriented x3 HEENT- PERRL, EOMI, non injected sclera, Injected bilat conjunctiva , mild matting right lower lids, MMM, oropharynx clear, eyes NT, NO SINUS tenderness, TM clear bilat  Neck- Supple, no LAD   CVS- RRR, no murmur RESP-CTAB        Assessment & Plan:      Problem List Items Addressed This Visit    None    Visit Diagnoses    Acute conjunctivitis of both eyes, unspecified acute conjunctivitis type    -  Primary   Initially concerned for possible allergic conjunctivit but does not respond to allergy drops, he did use red eye visine today, retry erythromycin ointment, if not improving then we will get him an urgent visit with his eye doctor.  Patient voiced understanding.  He can also use warm compresses      Note: This dictation was prepared with Dragon dictation along with smaller phrase technology. Any transcriptional errors that result from this process are unintentional.

## 2018-12-15 NOTE — Telephone Encounter (Signed)
polytrim 2 gtts q 4 hrs for 5 days

## 2018-12-15 NOTE — Patient Instructions (Signed)
Schedule physical with Dr. Dennard Schaumann Use ointment If not improved call me

## 2018-12-16 MED ORDER — POLYMYXIN B-TRIMETHOPRIM 10000-0.1 UNIT/ML-% OP SOLN: OPHTHALMIC | 0 refills | Status: DC

## 2018-12-16 NOTE — Telephone Encounter (Signed)
Med sent to pharm and pt aware via vm 

## 2018-12-29 ENCOUNTER — Other Ambulatory Visit: Payer: Self-pay

## 2018-12-29 ENCOUNTER — Encounter: Payer: Self-pay | Admitting: Family Medicine

## 2018-12-29 ENCOUNTER — Ambulatory Visit: Payer: 59 | Admitting: Family Medicine

## 2018-12-29 VITALS — BP 130/84 | HR 86 | Temp 99.6°F | Resp 14 | Ht 71.0 in | Wt 333.0 lb

## 2018-12-29 DIAGNOSIS — H5789 Other specified disorders of eye and adnexa: Secondary | ICD-10-CM

## 2018-12-29 DIAGNOSIS — H5711 Ocular pain, right eye: Secondary | ICD-10-CM | POA: Diagnosis not present

## 2018-12-29 DIAGNOSIS — Z83518 Family history of other specified eye disorder: Secondary | ICD-10-CM

## 2018-12-29 LAB — HM DIABETES EYE EXAM

## 2018-12-29 NOTE — Patient Instructions (Signed)
Eye doctor appointment

## 2018-12-29 NOTE — Progress Notes (Signed)
   Subjective:    Patient ID: Eric Johnson, male    DOB: 04-08-1960, 58 y.o.   MRN: [id number]  Patient presents for R Eye Irritation (Sat night had some burning in eye, Sunday noticed redness and edema- usually sees Dr.Supan)   Pt here with recurrent redness, burning of left eye and swelling., he has had eye issues on and off for the past year or so. Has family history of macular degeneration, he is diabetic He has tried allergy drops, OTC drops, most recently erythromycin ointment 2 weeks ago and it helped minimally. Sat eye worsened He has had mild blurriness and some matter from eye he has been wiping out   Pt is diabetic   Review Of Systems:  GEN- denies fatigue, fever, weight loss,weakness, recent illness HEENT- +eye drainage, change in vision, nasal discharge, CVS- denies chest pain, palpitations RESP- denies SOB, cough, wheeze Neuro- denies headache, dizziness, syncope, seizure activity       Objective:    BP 130/84   Pulse 86   Temp 99.6 F (37.6 C) (Oral)   Resp 14   Ht 5\' 11"  (1.803 m)   Wt (!) 333 lb (151 kg)   SpO2 94%   BMI 46.44 kg/m  GEN- NAD, alert and oriented x3 HEENT- PERRL, EOMI, non injected sclera, mild Injected right conjunctiva , mild matting right lower lids, swelling beneath right eye, no erythema, NT  MMM, oropharynx clear, vision grossly in tact , NO SINUS tenderness, TM clear bilat  Neck- Supple, no LAD  CVS- RRR, no murmur RESP-CTAB       Assessment & Plan:      Problem List Items Addressed This Visit    None    Visit Diagnoses    Eye swelling, right    -  Primary   Relevant Orders   Ambulatory referral to Ophthalmology   Acute right eye pain       Recurrent pain, drainage and swelling of eye, has been treated for bacterial infection 2 weeks, in past for same thing has tried allergy drops, send to eye doctor for further evaluation of pain  possible chronic inflammation/iritis/ etc no sign of periorbital cellulitis    Relevant  Orders   Ambulatory referral to Ophthalmology   Family history of macular degeneration       Relevant Orders   Ambulatory referral to Ophthalmology      Note: This dictation was prepared with Dragon dictation along with smaller phrase technology. Any transcriptional errors that result from this process are unintentional.

## 2019-01-06 ENCOUNTER — Other Ambulatory Visit: Payer: Self-pay | Admitting: Family Medicine

## 2019-02-03 ENCOUNTER — Other Ambulatory Visit: Payer: Self-pay | Admitting: Family Medicine

## 2019-02-03 ENCOUNTER — Encounter: Payer: Self-pay | Admitting: Family Medicine

## 2019-02-12 ENCOUNTER — Other Ambulatory Visit: Payer: Self-pay

## 2019-02-15 ENCOUNTER — Encounter: Payer: Self-pay | Admitting: Family Medicine

## 2019-02-15 ENCOUNTER — Ambulatory Visit (INDEPENDENT_AMBULATORY_CARE_PROVIDER_SITE_OTHER): Payer: 59 | Admitting: Family Medicine

## 2019-02-15 ENCOUNTER — Other Ambulatory Visit: Payer: Self-pay

## 2019-02-15 VITALS — BP 136/84 | HR 92 | Temp 97.3°F | Resp 18 | Ht 71.0 in | Wt 330.0 lb

## 2019-02-15 DIAGNOSIS — Z0001 Encounter for general adult medical examination with abnormal findings: Secondary | ICD-10-CM | POA: Diagnosis not present

## 2019-02-15 DIAGNOSIS — Z125 Encounter for screening for malignant neoplasm of prostate: Secondary | ICD-10-CM | POA: Diagnosis not present

## 2019-02-15 DIAGNOSIS — I10 Essential (primary) hypertension: Secondary | ICD-10-CM | POA: Diagnosis not present

## 2019-02-15 DIAGNOSIS — Z0000 Encounter for general adult medical examination without abnormal findings: Secondary | ICD-10-CM

## 2019-02-15 DIAGNOSIS — E119 Type 2 diabetes mellitus without complications: Secondary | ICD-10-CM

## 2019-02-15 DIAGNOSIS — E7800 Pure hypercholesterolemia, unspecified: Secondary | ICD-10-CM

## 2019-02-15 NOTE — Progress Notes (Signed)
Subjective:    Patient ID: Eric Johnson, male    DOB: 03/14/1961, 58 y.o.   MRN: [id number]  HPI Patient is a very pleasant 58 year old Caucasian male here today for complete physical exam.  He is still smoking several cigars every day.  He never went for a sleep study last year.  Partly this was due to difficulty scheduling due to COVID-19.  He is due for a colonoscopy.  He is also due for prostate cancer screening.  He had his flu shot performed at work.  He politely declines HIV and hepatitis C screening.  His diabetic foot exam was performed today and is completely normal aside from chronic venous insufficiency on his left shin and numerous varicose veins on his lower left ankle.  Patient's blood pressure today is well controlled at 136/84.  He denies any chest pain shortness of breath or dyspnea on exertion. Immunization History  Administered Date(s) Administered  . HPV Quadrivalent 01/08/2019  . Influenza Split 04/21/2009  . Influenza,inj,Quad PF,6+ Mos 12/25/2012, 01/03/2017  . Influenza-Unspecified 01/05/2016, 01/02/2018  . Pneumococcal Polysaccharide-23 06/13/2015  . Tdap 04/21/2009  . Zoster 10/30/2012    Past Medical History:  Diagnosis Date  . Bronchitis   . Diabetes mellitus   . Hypercholesteremia   . Hypertension   . Morbid obesity ([place]) 10/30/2012   Past Surgical History:  Procedure Laterality Date  . COLONOSCOPY N/A 08/27/2013   Procedure: COLONOSCOPY;  Surgeon: Daneil Dolin, MD;  Location: [facility];  Service: Endoscopy;  Laterality: N/A;  11:30 AM  . COLONOSCOPY W/ POLYPECTOMY  2000  . kidney stone removal     Current Outpatient Medications on File Prior to Visit  Medication Sig Dispense Refill  . aspirin 81 MG chewable tablet Chew 81 mg by mouth daily.    Marland Kitchen atorvastatin (LIPITOR) 40 MG tablet Take 1 tablet by mouth once daily 30 tablet 3  . beta carotene w/minerals (OCUVITE) tablet Take 1 tablet by mouth daily.    . Cyanocobalamin (VITAMIN B 12 PO) Take  by mouth.    . hydrochlorothiazide (HYDRODIURIL) 25 MG tablet Take 1 tablet by mouth once daily 30 tablet 3  . metFORMIN (GLUCOPHAGE) 500 MG tablet TAKE 2 TABLETS BY MOUTH TWICE DAILY WITH MEALS 120 tablet 0  . Omega-3 Fatty Acids (FISH OIL) 1200 MG CPDR Take by mouth.    . saw palmetto 160 MG capsule Take 160 mg by mouth daily.      No current facility-administered medications on file prior to visit.    Allergies  Allergen Reactions  . Losartan Swelling  . Penicillins    Social History   Socioeconomic History  . Marital status: Married    Spouse name: Not on file  . Number of children: Not on file  . Years of education: Not on file  . Highest education level: Not on file  Occupational History  . Not on file  Social Needs  . Financial resource strain: Not on file  . Food insecurity    Worry: Not on file    Inability: Not on file  . Transportation needs    Medical: Not on file    Non-medical: Not on file  Tobacco Use  . Smoking status: Current Some Day Smoker    Years: 5.00    Types: Cigars  . Smokeless tobacco: Former Systems developer    Types: Snuff  Substance and Sexual Activity  . Alcohol use: Yes    Alcohol/week: 0.0 standard drinks  Comment: Occ wine  . Drug use: No  . Sexual activity: Not on file  Lifestyle  . Physical activity    Days per week: Not on file    Minutes per session: Not on file  . Stress: Not on file  Relationships  . Social Herbalist on phone: Not on file    Gets together: Not on file    Attends religious service: Not on file    Active member of club or organization: Not on file    Attends meetings of clubs or organizations: Not on file    Relationship status: Not on file  . Intimate partner violence    Fear of current or ex partner: Not on file    Emotionally abused: Not on file    Physically abused: Not on file    Forced sexual activity: Not on file  Other Topics Concern  . Not on file  Social History Narrative  . Not on file    Family History  Problem Relation Age of Onset  . Heart Problems Mother       Review of Systems  All other systems reviewed and are negative.      Objective:   Physical Exam  Constitutional: He is oriented to person, place, and time. He appears well-developed and well-nourished. No distress.  HENT:  Head: Normocephalic and atraumatic.  Right Ear: External ear normal.  Left Ear: External ear normal.  Nose: Nose normal.  Mouth/Throat: Oropharynx is clear and moist. No oropharyngeal exudate.  Eyes: Pupils are equal, round, and reactive to light. Conjunctivae and EOM are normal. Right eye exhibits no discharge. Left eye exhibits no discharge. No scleral icterus.  Neck: Normal range of motion. Neck supple. No JVD present. No tracheal deviation present. No thyromegaly present.  Cardiovascular: Normal rate, regular rhythm, normal heart sounds and intact distal pulses. Exam reveals no gallop and no friction rub.  No murmur heard. Pulmonary/Chest: Effort normal and breath sounds normal. No stridor. No respiratory distress. He has no wheezes. He has no rales. He exhibits no tenderness.  Abdominal: Soft. Bowel sounds are normal. He exhibits no distension and no mass. There is no abdominal tenderness. There is no rebound and no guarding.  Musculoskeletal: Normal range of motion.        General: Edema present. No tenderness or deformity.  Lymphadenopathy:    He has no cervical adenopathy.  Neurological: He is alert and oriented to person, place, and time. He has normal reflexes. No cranial nerve deficit. He exhibits normal muscle tone. Coordination normal.  Skin: He is not diaphoretic.  Psychiatric: He has a normal mood and affect. His behavior is normal. Judgment and thought content normal.  Vitals reviewed.        Assessment & Plan:  General medical exam  Controlled type 2 diabetes mellitus without complication, without long-term current use of insulin ([place]) - Plan: Hemoglobin A1c,  CBC with Differential, COMPLETE METABOLIC PANEL WITH GFR, Lipid Panel, Microalbumin, urine  Prostate cancer screening - Plan: PSA  Benign essential HTN  Hypercholesteremia  Morbid obesity ([place])  My biggest concern for this patient right now is elevated BMI.  I am almost certain the patient has obstructive sleep apnea.  I have strongly encouraged the patient to get a sleep study.  He would like to defer that at the present time but he will call me back whenever he is ready for me to schedule this.  His blood pressure today is well controlled at  136/84.  I would like to check fasting lab work to monitor his diabetes including hemoglobin A1c, CBC, CMP, fasting lipid panel, and a urine microalbumin.  I will screen for prostate cancer with a PSA.  I have recommended the patient schedule a colonoscopy and he will do this at his earliest convenience.  Patient's immunizations are up-to-date.  Diabetic eye exam is up-to-date.  He declines HIV and hepatitis C screening.

## 2019-02-16 ENCOUNTER — Other Ambulatory Visit: Payer: Self-pay | Admitting: Family Medicine

## 2019-02-16 DIAGNOSIS — I10 Essential (primary) hypertension: Secondary | ICD-10-CM

## 2019-02-16 LAB — COMPLETE METABOLIC PANEL WITH GFR
AG Ratio: 1.7 (calc) (ref 1.0–2.5)
ALT: 27 U/L (ref 9–46)
AST: 20 U/L (ref 10–35)
Albumin: 4.2 g/dL (ref 3.6–5.1)
Alkaline phosphatase (APISO): 60 U/L (ref 35–144)
BUN: 16 mg/dL (ref 7–25)
CO2: 29 mmol/L (ref 20–32)
Calcium: 9.4 mg/dL (ref 8.6–10.3)
Chloride: 103 mmol/L (ref 98–110)
Creat: 0.94 mg/dL (ref 0.70–1.33)
GFR, Est African American: 102 mL/min/{1.73_m2} (ref >=60)
GFR, Est Non African American: 88 mL/min/{1.73_m2} (ref >=60)
Globulin: 2.5 g/dL (calc) (ref 1.9–3.7)
Glucose, Bld: 136 mg/dL — ABNORMAL HIGH (ref 65–99)
Potassium: 4 mmol/L (ref 3.5–5.3)
Sodium: 141 mmol/L (ref 135–146)
Total Bilirubin: 0.5 mg/dL (ref 0.2–1.2)
Total Protein: 6.7 g/dL (ref 6.1–8.1)

## 2019-02-16 LAB — MICROALBUMIN, URINE: Microalb, Ur: 1.2 mg/dL

## 2019-02-16 LAB — CBC WITH DIFFERENTIAL/PLATELET
Absolute Monocytes: 547 cells/uL (ref 200–950)
Basophils Absolute: 28 cells/uL (ref 0–200)
Basophils Relative: 0.4 %
Eosinophils Absolute: 107 cells/uL (ref 15–500)
Eosinophils Relative: 1.5 %
HCT: 45.5 % (ref 38.5–50.0)
Hemoglobin: 15.9 g/dL (ref 13.2–17.1)
Lymphs Abs: 1441 cells/uL (ref 850–3900)
MCH: 33.3 pg — ABNORMAL HIGH (ref 27.0–33.0)
MCHC: 34.9 g/dL (ref 32.0–36.0)
MCV: 95.4 fL (ref 80.0–100.0)
MPV: 11 fL (ref 7.5–12.5)
Monocytes Relative: 7.7 %
Neutro Abs: 4977 cells/uL (ref 1500–7800)
Neutrophils Relative %: 70.1 %
Platelets: 231 10*3/uL (ref 140–400)
RBC: 4.77 10*6/uL (ref 4.20–5.80)
RDW: 12.7 % (ref 11.0–15.0)
Total Lymphocyte: 20.3 %
WBC: 7.1 10*3/uL (ref 3.8–10.8)

## 2019-02-16 LAB — LIPID PANEL
Cholesterol: 110 mg/dL (ref <200)
HDL: 35 mg/dL — ABNORMAL LOW (ref >=40)
LDL Cholesterol (Calc): 55 mg/dL (calc)
Non-HDL Cholesterol (Calc): 75 mg/dL (calc) (ref <130)
Total CHOL/HDL Ratio: 3.1 (calc) (ref <5.0)
Triglycerides: 115 mg/dL (ref <150)

## 2019-02-16 LAB — HEMOGLOBIN A1C
Hgb A1c MFr Bld: 6.6 % of total Hgb — ABNORMAL HIGH (ref <5.7)
Mean Plasma Glucose: 143 (calc)
eAG (mmol/L): 7.9 (calc)

## 2019-02-16 LAB — PSA: PSA: 0.6 ng/mL (ref <=4.0)

## 2019-03-03 ENCOUNTER — Other Ambulatory Visit: Payer: Self-pay | Admitting: Family Medicine

## 2019-04-07 ENCOUNTER — Other Ambulatory Visit: Payer: Self-pay | Admitting: Family Medicine

## 2019-05-06 ENCOUNTER — Other Ambulatory Visit: Payer: Self-pay | Admitting: Family Medicine

## 2019-05-06 MED ORDER — METFORMIN HCL 500 MG PO TABS: 1000.0000 mg | ORAL_TABLET | Freq: Two times a day (BID) | ORAL | 2 refills | Status: DC

## 2019-05-24 ENCOUNTER — Encounter: Payer: Self-pay | Admitting: Family Medicine

## 2019-05-24 ENCOUNTER — Other Ambulatory Visit: Payer: Self-pay

## 2019-05-24 ENCOUNTER — Ambulatory Visit (INDEPENDENT_AMBULATORY_CARE_PROVIDER_SITE_OTHER): Payer: 59 | Admitting: Family Medicine

## 2019-05-24 VITALS — BP 110/64 | HR 88 | Temp 97.2°F | Resp 16 | Ht 71.0 in | Wt 328.0 lb

## 2019-05-24 DIAGNOSIS — R599 Enlarged lymph nodes, unspecified: Secondary | ICD-10-CM

## 2019-05-24 DIAGNOSIS — K056 Periodontal disease, unspecified: Secondary | ICD-10-CM

## 2019-05-24 MED ORDER — CLINDAMYCIN HCL 300 MG PO CAPS: 300.0000 mg | ORAL_CAPSULE | Freq: Three times a day (TID) | ORAL | 0 refills | Status: DC

## 2019-05-24 NOTE — Progress Notes (Signed)
Subjective:    Patient ID: Eric Johnson, male    DOB: [birth year], 59 y.o.   MRN: [id number]  HPI  Patient reports pain and swelling at the angles of the mandible laterally.  There is soft tissue swelling in the area around the parotid gland.  There is no defined mass enlarging the parotid gland.  I believe is reactive lymphadenopathy in that area.  He also has some swelling in the submandibular areas bilaterally.  Patient denies any sinus pain.  He denies any facial pain.  He denies any otalgia.  However he has very poor dentition.  There is black debris in the spaces between his teeth.  There is erythema and inflammation in the gingiva around the teeth.  Patient clearly has periodontal inflammation due to poor dentition Past Medical History:  Diagnosis Date  . Bronchitis   . Diabetes mellitus   . Hypercholesteremia   . Hypertension   . Morbid obesity ([place]) 10/30/2012   Past Surgical History:  Procedure Laterality Date  . COLONOSCOPY N/A 08/27/2013   Procedure: COLONOSCOPY;  Surgeon: Daneil Dolin, MD;  [facility];  Service: Endoscopy;  Laterality: N/A;  11:30 AM  . COLONOSCOPY W/ POLYPECTOMY  2000  . kidney stone removal     Current Outpatient Medications on File Prior to Visit  Medication Sig Dispense Refill  . aspirin 81 MG chewable tablet Chew 81 mg by mouth daily.    Marland Kitchen atorvastatin (LIPITOR) 40 MG tablet Take 1 tablet by mouth once daily 90 tablet 1  . beta carotene w/minerals (OCUVITE) tablet Take 1 tablet by mouth daily.    . Cyanocobalamin (VITAMIN B 12 PO) Take by mouth.    . hydrochlorothiazide (HYDRODIURIL) 25 MG tablet Take 1 tablet by mouth once daily 90 tablet 3  . metFORMIN (GLUCOPHAGE) 500 MG tablet Take 2 tablets (1,000 mg total) by mouth 2 (two) times daily with a meal. 120 tablet 2  . Omega-3 Fatty Acids (FISH OIL) 1200 MG CPDR Take by mouth.    . saw palmetto 160 MG capsule Take 160 mg by mouth daily.      No current facility-administered medications  on file prior to visit.   Allergies  Allergen Reactions  . Losartan Swelling  . Penicillins    Social History   Socioeconomic History  . Marital status: Married    Spouse name: Not on file  . Number of children: Not on file  . Years of education: Not on file  . Highest education level: Not on file  Occupational History  . Not on file  Tobacco Use  . Smoking status: Current Some Day Smoker    Years: 5.00    Types: Cigars  . Smokeless tobacco: Former Systems developer    Types: Snuff  Substance and Sexual Activity  . Alcohol use: Yes    Alcohol/week: 0.0 standard drinks    Comment: Occ wine  . Drug use: No  . Sexual activity: Not on file  Other Topics Concern  . Not on file  Social History Narrative  . Not on file   Social Determinants of Health   Financial Resource Strain:   . Difficulty of Paying Living Expenses: Not on file  Food Insecurity:   . Worried About Charity fundraiser in the Last Year: Not on file  . Ran Out of Food in the Last Year: Not on file  Transportation Needs:   . Lack of Transportation (Medical): Not on file  . Lack of  Transportation (Non-Medical): Not on file  Physical Activity:   . Days of Exercise per Week: Not on file  . Minutes of Exercise per Session: Not on file  Stress:   . Feeling of Stress : Not on file  Social Connections:   . Frequency of Communication with Friends and Family: Not on file  . Frequency of Social Gatherings with Friends and Family: Not on file  . Attends Religious Services: Not on file  . Active Member of Clubs or Organizations: Not on file  . Attends Archivist Meetings: Not on file  . Marital Status: Not on file  Intimate Partner Violence:   . Fear of Current or Ex-Partner: Not on file  . Emotionally Abused: Not on file  . Physically Abused: Not on file  . Sexually Abused: Not on file     Review of Systems  All other systems reviewed and are negative.      Objective:   Physical Exam  Constitutional:  He appears well-developed and well-nourished.  HENT:  Head: Normocephalic and atraumatic.    Right Ear: External ear normal.  Left Ear: External ear normal.  Nose: Nose normal.  Mouth/Throat: Oropharynx is clear and moist. Abnormal dentition. Dental caries present. No dental abscesses or uvula swelling.  Eyes: Conjunctivae are normal.  Cardiovascular: Normal rate, regular rhythm, normal heart sounds and intact distal pulses.  No murmur heard. Pulmonary/Chest: Effort normal and breath sounds normal. No respiratory distress. He has no wheezes. He has no rales.  Musculoskeletal:     Cervical back: Neck supple.  Lymphadenopathy:    He has no cervical adenopathy.  Vitals reviewed.         Assessment & Plan:  Reactive lymphadenopathy suspected due to periodontal disease.  Begin clindamycin 300 mg 3 times daily for 10 days and reassess in 48 hours

## 2019-06-12 ENCOUNTER — Ambulatory Visit: Payer: 59

## 2019-06-12 ENCOUNTER — Ambulatory Visit: Payer: Self-pay | Attending: Internal Medicine

## 2019-06-12 DIAGNOSIS — Z23 Encounter for immunization: Secondary | ICD-10-CM

## 2019-06-12 NOTE — Progress Notes (Signed)
   Covid-19 Vaccination Clinic  Name:  JAHID APLEY    MRN: [id number] DOB: April 23, 1960  06/12/2019  Mr. Burdett was observed post Covid-19 immunization for 15 minutes without incident. He was provided with Vaccine Information Sheet and instruction to access the V-Safe system.   Mr. Ham was instructed to call 911 with any severe reactions post vaccine: Marland Kitchen Difficulty breathing  . Swelling of face and throat  . A fast heartbeat  . A bad rash all over body  . Dizziness and weakness   Immunizations Administered    Name Date Dose VIS Date Route   Pfizer COVID-19 Vaccine 06/12/2019  2:05 PM 0.3 mL 03/12/2019 Intramuscular   Manufacturer: [place]   Lot: [id number]   [place]: [id number]

## 2019-07-07 ENCOUNTER — Ambulatory Visit: Payer: Self-pay | Attending: Internal Medicine

## 2019-07-07 DIAGNOSIS — Z23 Encounter for immunization: Secondary | ICD-10-CM

## 2019-07-07 NOTE — Progress Notes (Signed)
   Covid-19 Vaccination Clinic  Name:  Eric Johnson    MRN: [id number] DOB: 11/23/1960  07/07/2019  [name] was observed post Covid-19 immunization for 15 minutes without incident. He was provided with Vaccine Information Sheet and instruction to access the V-Safe system.   [name] was instructed to call 911 with any severe reactions post vaccine: Marland Kitchen Difficulty breathing  . Swelling of face and throat  . A fast heartbeat  . A bad rash all over body  . Dizziness and weakness   Immunizations Administered    Name Date Dose VIS Date Route   Pfizer COVID-19 Vaccine 07/07/2019  3:59 PM 0.3 mL 03/12/2019 Intramuscular   Manufacturer: [employer]   Lot: [id number]   [place]: [id number]

## 2019-07-12 ENCOUNTER — Ambulatory Visit (INDEPENDENT_AMBULATORY_CARE_PROVIDER_SITE_OTHER): Payer: BC Managed Care – PPO | Admitting: Nurse Practitioner

## 2019-07-12 ENCOUNTER — Other Ambulatory Visit: Payer: Self-pay

## 2019-07-12 ENCOUNTER — Ambulatory Visit: Payer: BC Managed Care – PPO | Attending: Internal Medicine

## 2019-07-12 DIAGNOSIS — Z20822 Contact with and (suspected) exposure to covid-19: Secondary | ICD-10-CM | POA: Insufficient documentation

## 2019-07-12 DIAGNOSIS — B349 Viral infection, unspecified: Secondary | ICD-10-CM | POA: Diagnosis not present

## 2019-07-12 NOTE — Progress Notes (Signed)
Virtual Visit via Telephone Note  I connected with Eric Johnson on 07/12/19 at  9:15 AM EDT by telephone and verified that I am speaking with the correct person using two identifiers.   I discussed the limitations, risks, security and privacy concerns of performing an evaluation and management service by telephone and the availability of in person appointments. I also discussed with the patient that there may be a patient responsible charge related to this service. The patient expressed understanding and agreed to proceed.   History of Present Illness: Pt is a 59 year old male presenting for telephone visit for sxs of last Monday sore throat runny nose, thought was getting better wed second covid vaccine. Sore throat cough runny nose general aches and pain, chills, fatigue.no cough, los of smell or taste. No sinus pressure. Not taken any medication or other treatments. Wife with laryngitis-seasonal allergies. He reports that he usually is not affected by seasonal allergies. No loss of taste, smell, or cough. No cp/ct, gi/gu sxs, pain, sob, edema.    Observations/Objective: A/O x4, speaks full sentences, no cough, sob heard. Does not sound to be in acute distress.  Assessment and Plan: Your symptoms suggest that you need a COVID test. If negative make appt to be seen in clinic. Your should take over the counter Claritin and Flonase, and may take tylenol or ibuprofen for general aches and pains. May take other over the counter medications for your sxs.   Follow Up Instructions:    I discussed the assessment and treatment plan with the patient. The patient was provided an opportunity to ask questions and all were answered. The patient agreed with the plan and demonstrated an understanding of the instructions.   The patient was advised to call back or seek an in-person evaluation if the symptoms worsen or if the condition fails to improve as anticipated.  I provided 15 minutes of non-face-to-face  time during this encounter.  End time [place], FNP

## 2019-07-12 NOTE — Patient Instructions (Signed)
Viral Illness May treat with over the counter medication for symptom relief COVID testing today Seek medical attention for non resolving or worsening sxs.

## 2019-07-13 LAB — SARS-COV-2, NAA 2 DAY TAT

## 2019-07-13 LAB — NOVEL CORONAVIRUS, NAA: SARS-CoV-2, NAA: NOT DETECTED

## 2019-08-03 ENCOUNTER — Other Ambulatory Visit: Payer: Self-pay | Admitting: Family Medicine

## 2019-09-03 ENCOUNTER — Other Ambulatory Visit: Payer: Self-pay

## 2019-09-03 ENCOUNTER — Emergency Department (HOSPITAL_COMMUNITY)
Admission: EM | Admit: 2019-09-03 | Discharge: 2019-09-04 | Disposition: A | Discharge status: Home / Self Care | Payer: BC Managed Care – PPO | Attending: Emergency Medicine | Admitting: Emergency Medicine

## 2019-09-03 ENCOUNTER — Encounter (HOSPITAL_COMMUNITY): Payer: Self-pay | Admitting: *Deleted

## 2019-09-03 DIAGNOSIS — E119 Type 2 diabetes mellitus without complications: Secondary | ICD-10-CM | POA: Diagnosis not present

## 2019-09-03 DIAGNOSIS — R319 Hematuria, unspecified: Secondary | ICD-10-CM | POA: Diagnosis not present

## 2019-09-03 DIAGNOSIS — N201 Calculus of ureter: Secondary | ICD-10-CM | POA: Diagnosis not present

## 2019-09-03 DIAGNOSIS — R109 Unspecified abdominal pain: Secondary | ICD-10-CM | POA: Diagnosis not present

## 2019-09-03 DIAGNOSIS — F17290 Nicotine dependence, other tobacco product, uncomplicated: Secondary | ICD-10-CM | POA: Diagnosis not present

## 2019-09-03 DIAGNOSIS — Z7982 Long term (current) use of aspirin: Secondary | ICD-10-CM | POA: Insufficient documentation

## 2019-09-03 DIAGNOSIS — Z7984 Long term (current) use of oral hypoglycemic drugs: Secondary | ICD-10-CM | POA: Diagnosis not present

## 2019-09-03 DIAGNOSIS — N23 Unspecified renal colic: Secondary | ICD-10-CM | POA: Insufficient documentation

## 2019-09-03 DIAGNOSIS — Z79899 Other long term (current) drug therapy: Secondary | ICD-10-CM | POA: Insufficient documentation

## 2019-09-03 DIAGNOSIS — I10 Essential (primary) hypertension: Secondary | ICD-10-CM | POA: Diagnosis not present

## 2019-09-03 HISTORY — DX: Calculus of kidney: N20.0

## 2019-09-03 MED ORDER — ONDANSETRON HCL 4 MG/2ML IJ SOLN
4.0000 mg | Freq: Once | INTRAMUSCULAR | Status: AC
  Administered 2019-09-03: 4 mg via INTRAVENOUS
  Filled 2019-09-03: qty 2

## 2019-09-03 MED ORDER — HYDROMORPHONE HCL 1 MG/ML IJ SOLN
1.0000 mg | Freq: Once | INTRAMUSCULAR | Status: AC
  Administered 2019-09-03: 1 mg via INTRAVENOUS
  Filled 2019-09-03: qty 1

## 2019-09-03 NOTE — ED Triage Notes (Signed)
Pt with right flank pain since Monday/Tuesday, worse today.  Pt with hx of kidney stones. Denies blood in urine.

## 2019-09-03 NOTE — ED Provider Notes (Signed)
[facility] EMERGENCY DEPARTMENT Provider Note   CSN: [id number] Arrival date & time: 09/03/19  2153     History Chief Complaint  Patient presents with  . Flank Pain    right     Eric Johnson is a 59 y.o. male.  Patient presents to the emergency department for evaluation of right flank pain.  Patient reports a history of recurrent kidney stones and thinks this is another kidney stone.  He has been having some urinary urgency and low urine volumes over the last couple of days and then around 5 PM today started having severe right flank pain.  He has noticed some blood in his urine.        Past Medical History:  Diagnosis Date  . Bronchitis   . Diabetes mellitus   . Hypercholesteremia   . Hypertension   . Kidney stones   . Morbid obesity ([place]) 10/30/2012    Patient Active Problem List   Diagnosis Date Noted  . Morbid obesity ([place]) 10/30/2012  . Diabetes mellitus   . Hypercholesteremia   . Hypertension     Past Surgical History:  Procedure Laterality Date  . COLONOSCOPY N/A 08/27/2013   Procedure: COLONOSCOPY;  Surgeon: Daneil Dolin, MD;  Location: [facility];  Service: Endoscopy;  Laterality: N/A;  11:30 AM  . COLONOSCOPY W/ POLYPECTOMY  2000  . kidney stone removal         Family History  Problem Relation Age of Onset  . Heart Problems Mother     Social History   Tobacco Use  . Smoking status: Current Some Day Smoker    Years: 5.00    Types: Cigars  . Smokeless tobacco: Former Systems developer    Types: Snuff  Substance Use Topics  . Alcohol use: Yes    Alcohol/week: 0.0 standard drinks    Comment: Occ wine  . Drug use: No    Home Medications Prior to Admission medications   Medication Sig Start Date End Date Taking? Authorizing Provider  aspirin 81 MG chewable tablet Chew 81 mg by mouth daily.    [provider]  atorvastatin (LIPITOR) 40 MG tablet Take 1 tablet by mouth once daily 04/08/19   [name], MD  beta carotene w/minerals  (OCUVITE) tablet Take 1 tablet by mouth daily.    [provider]  clindamycin (CLEOCIN) 300 MG capsule Take 1 capsule (300 mg total) by mouth 3 (three) times daily. 05/24/19   [name], MD  Cyanocobalamin (VITAMIN B 12 PO) Take by mouth.    [provider]  hydrochlorothiazide (HYDRODIURIL) 25 MG tablet Take 1 tablet by mouth once daily 02/17/19   [name], MD  metFORMIN (GLUCOPHAGE) 500 MG tablet TAKE 2 TABLETS(1000 MG) BY MOUTH TWICE DAILY WITH A MEAL 08/03/19   [name], MD  Omega-3 Fatty Acids (FISH OIL) 1200 MG CPDR Take by mouth.    [provider]  oxyCODONE-acetaminophen (PERCOCET) 5-325 MG tablet Take 1 tablet by mouth every 4 (four) hours as needed. 09/04/19   Orpah Greek, MD  oxyCODONE-acetaminophen (PERCOCET) 5-325 MG tablet Take 1-2 tablets by mouth every 4 (four) hours as needed. 09/04/19   Orpah Greek, MD  saw palmetto 160 MG capsule Take 160 mg by mouth daily.     [provider]  tamsulosin (FLOMAX) 0.4 MG CAPS capsule Take 1 capsule (0.4 mg total) by mouth daily. 09/04/19   Orpah Greek, MD    Allergies  Losartan and Penicillins  Review of Systems   Review of Systems  Genitourinary: Positive for flank pain and hematuria.  All other systems reviewed and are negative.   Physical Exam Updated Vital Signs BP (!) 156/88 (BP Location: Left Arm)   Pulse 93   Temp 98.7 F (37.1 C) (Oral)   Resp 19   Ht 5\' 11"  (1.803 m)   Wt 132.9 kg   SpO2 99%   BMI 40.87 kg/m   Physical Exam Vitals and nursing note reviewed.  Constitutional:      General: He is in acute distress.     Appearance: Normal appearance. He is well-developed.  HENT:     Head: Normocephalic and atraumatic.     Right Ear: Hearing normal.     Left Ear: Hearing normal.     Nose: Nose normal.  Eyes:     Conjunctiva/sclera: Conjunctivae normal.     Pupils: Pupils are equal, round, and reactive to light.    Cardiovascular:     Rate and Rhythm: Regular rhythm.     Heart sounds: S1 normal and S2 normal. No murmur. No friction rub. No gallop.   Pulmonary:     Effort: Pulmonary effort is normal. No respiratory distress.     Breath sounds: Normal breath sounds.  Chest:     Chest wall: No tenderness.  Abdominal:     General: Bowel sounds are normal.     Palpations: Abdomen is soft.     Tenderness: There is no abdominal tenderness. There is no guarding or rebound. Negative signs include Murphy's sign and McBurney's sign.     Hernia: No hernia is present.  Musculoskeletal:        General: Normal range of motion.     Cervical back: Normal range of motion and neck supple.  Skin:    General: Skin is warm and dry.     Findings: No rash.  Neurological:     Mental Status: He is alert and oriented to person, place, and time.     GCS: GCS eye subscore is 4. GCS verbal subscore is 5. GCS motor subscore is 6.     Cranial Nerves: No cranial nerve deficit.     Sensory: No sensory deficit.     Coordination: Coordination normal.  Psychiatric:        Speech: Speech normal.        Behavior: Behavior normal.        Thought Content: Thought content normal.     ED Results / Procedures / Treatments   Labs (all labs ordered are listed, but only abnormal results are displayed) Labs Reviewed  CBC WITH DIFFERENTIAL/PLATELET - Abnormal; Notable for the following components:      Result Value   WBC 11.9 (*)    Neutro Abs 9.4 (*)    All other components within normal limits  BASIC METABOLIC PANEL - Abnormal; Notable for the following components:   Glucose, Bld 174 (*)    BUN 23 (*)    Creatinine, Ser 1.31 (*)    GFR calc non Af Amer 59 (*)    All other components within normal limits  URINALYSIS, ROUTINE W REFLEX MICROSCOPIC - Abnormal; Notable for the following components:   APPearance CLOUDY (*)    Specific Gravity, Urine 1.031 (*)    Hgb urine dipstick LARGE (*)    Protein, ur 30 (*)    RBC / HPF  >50 (*)    All other components within normal limits  EKG None  Radiology CT RENAL STONE STUDY  Result Date: 09/04/2019 CLINICAL DATA:  Right flank pain.  History of kidney stones. EXAM: CT ABDOMEN AND PELVIS WITHOUT CONTRAST TECHNIQUE: Multidetector CT imaging of the abdomen and pelvis was performed following the standard protocol without IV contrast. COMPARISON:  Most recent CT 04/25/2014 FINDINGS: Lower chest: Subsegmental atelectasis at the right lung base. Stable 5 mm left lower lobe pulmonary nodule, stability over 5 years indicates benign process. Hepatobiliary: Focal liver abnormality is seen. Calcified gallstones without pericholecystic inflammation. No biliary dilatation. Pancreas: No ductal dilatation or inflammation. Spleen: Normal in size without focal abnormality. Splenule anterior inferiorly. Adrenals/Urinary Tract: Normal adrenal glands. Obstructing 3 x 4 mm stone at the right ureterovesicular junction with moderate hydroureteronephrosis and perinephric edema. Additional 6 mm nonobstructing stone in the lower right kidney. No left hydronephrosis or urolithiasis. There is mild left perinephric edema. The urinary bladder is minimally distended. Mild wall thickening may be reactive. Stomach/Bowel: Ingested material distends the stomach. No bowel obstruction or inflammation. Normal appendix. Moderate volume of stool throughout the colon. Scattered colonic diverticula involving the descending and sigmoid colon. No diverticulitis. Vascular/Lymphatic: Mild aorto bi-iliac atherosclerosis. No aneurysm. No adenopathy. Reproductive: Enlarged prostate gland spans 6.2 cm transverse causing mild mass effect on the bladder base. Other: Small fat containing umbilical hernia. Fat within the left inguinal canal. No free air, free fluid, or intra-abdominal fluid collection. Musculoskeletal: Multilevel degenerative change throughout the lumbar spine. Mild degenerative change of both hips. Stable  benign-appearing sclerotic density in the left iliac bone. IMPRESSION: 1. Obstructing 3 x 4 mm stone at the right ureterovesicular junction with moderate hydronephrosis and perinephric edema. 2. Additional nonobstructing stone in the lower right kidney. 3. Cholelithiasis without gallbladder inflammation. 4. Colonic diverticulosis without diverticulitis. 5. Enlarged prostate gland causing mild mass effect on the bladder base. Aortic Atherosclerosis (ICD10-I70.0). Electronically Signed   By: Keith Rake M.D.   On: 09/04/2019 01:49    Procedures Procedures (including critical care time)  Medications Ordered in ED Medications  ketorolac (TORADOL) 30 MG/ML injection 15 mg (has no administration in time range)  HYDROmorphone (DILAUDID) injection 1 mg (1 mg Intravenous Given 09/03/19 2357)  ondansetron (ZOFRAN) injection 4 mg (4 mg Intravenous Given 09/03/19 2357)    ED Course  I have reviewed the triage vital signs and the nursing notes.  Pertinent labs & imaging results that were available during my care of the patient were reviewed by me and considered in my medical decision making (see chart for details).    MDM Rules/Calculators/A&P                      Patient presents to the emergency department for evaluation of right flank pain.  Patient does have a history of multiple kidney stones in the past.  He reports he has been able to pass all them fairly quickly previously.  He has been having intermittent symptoms for 4 or 5 days and now the pain is becoming severe.  Patient confirmed to have a 3 x 4 mm right UVJ stone with moderate hydronephrosis.  No sign of urinary infection.  Patient improved with analgesia.  Will discharge, follow-up with urology.  Final Clinical Impression(s) / ED Diagnoses Final diagnoses:  Ureterolithiasis  Renal colic on right side    Rx / DC Orders ED Discharge Orders         Ordered    oxyCODONE-acetaminophen (PERCOCET) 5-325 MG tablet  Every 4 hours PRN  09/04/19 0159    oxyCODONE-acetaminophen (PERCOCET) 5-325 MG tablet  Every 4 hours PRN     09/04/19 0204    tamsulosin (FLOMAX) 0.4 MG CAPS capsule  Daily     09/04/19 0204           Orpah Greek, MD 09/04/19 [phone]

## 2019-09-04 ENCOUNTER — Emergency Department (HOSPITAL_COMMUNITY): Payer: BC Managed Care – PPO

## 2019-09-04 DIAGNOSIS — R109 Unspecified abdominal pain: Secondary | ICD-10-CM | POA: Diagnosis not present

## 2019-09-04 LAB — BASIC METABOLIC PANEL
Anion gap: 11 (ref 5–15)
BUN: 23 mg/dL — ABNORMAL HIGH (ref 6–20)
CO2: 26 mmol/L (ref 22–32)
Calcium: 9.2 mg/dL (ref 8.9–10.3)
Chloride: 102 mmol/L (ref 98–111)
Creatinine, Ser: 1.31 mg/dL — ABNORMAL HIGH (ref 0.61–1.24)
GFR calc Af Amer: >60 mL/min (ref >60)
GFR calc non Af Amer: 59 mL/min — ABNORMAL LOW (ref >60)
Glucose, Bld: 174 mg/dL — ABNORMAL HIGH (ref 70–99)
Potassium: 3.8 mmol/L (ref 3.5–5.1)
Sodium: 139 mmol/L (ref 135–145)

## 2019-09-04 LAB — CBC WITH DIFFERENTIAL/PLATELET
Abs Immature Granulocytes: 0.04 10*3/uL (ref 0.00–0.07)
Basophils Absolute: 0.1 10*3/uL (ref 0.0–0.1)
Basophils Relative: 0 %
Eosinophils Absolute: 0.2 10*3/uL (ref 0.0–0.5)
Eosinophils Relative: 2 %
HCT: 44.7 % (ref 39.0–52.0)
Hemoglobin: 15.3 g/dL (ref 13.0–17.0)
Immature Granulocytes: 0 %
Lymphocytes Relative: 11 %
Lymphs Abs: 1.4 10*3/uL (ref 0.7–4.0)
MCH: 32.8 pg (ref 26.0–34.0)
MCHC: 34.2 g/dL (ref 30.0–36.0)
MCV: 95.7 fL (ref 80.0–100.0)
Monocytes Absolute: 0.9 10*3/uL (ref 0.1–1.0)
Monocytes Relative: 8 %
Neutro Abs: 9.4 10*3/uL — ABNORMAL HIGH (ref 1.7–7.7)
Neutrophils Relative %: 79 %
Platelets: 235 10*3/uL (ref 150–400)
RBC: 4.67 MIL/uL (ref 4.22–5.81)
RDW: 13.2 % (ref 11.5–15.5)
WBC: 11.9 10*3/uL — ABNORMAL HIGH (ref 4.0–10.5)
nRBC: 0 % (ref 0.0–0.2)

## 2019-09-04 LAB — URINALYSIS, ROUTINE W REFLEX MICROSCOPIC
Bacteria, UA: NONE SEEN
Bilirubin Urine: NEGATIVE
Glucose, UA: NEGATIVE mg/dL
Ketones, ur: NEGATIVE mg/dL
Leukocytes,Ua: NEGATIVE
Nitrite: NEGATIVE
Protein, ur: 30 mg/dL — AB
RBC / HPF: >50 RBC/hpf — ABNORMAL HIGH (ref 0–5)
Specific Gravity, Urine: 1.031 — ABNORMAL HIGH (ref 1.005–1.030)
pH: 5 (ref 5.0–8.0)

## 2019-09-04 MED ORDER — TAMSULOSIN HCL 0.4 MG PO CAPS: 0.4000 mg | ORAL_CAPSULE | Freq: Every day | ORAL | 0 refills | Status: DC

## 2019-09-04 MED ORDER — OXYCODONE-ACETAMINOPHEN 5-325 MG PO TABS: 1.0000 | ORAL_TABLET | ORAL | 0 refills | Status: DC | PRN

## 2019-09-04 MED ORDER — KETOROLAC TROMETHAMINE 30 MG/ML IJ SOLN
15.0000 mg | Freq: Once | INTRAMUSCULAR | Status: AC
  Administered 2019-09-04: 15 mg via INTRAVENOUS
  Filled 2019-09-04: qty 1

## 2019-09-06 MED FILL — Oxycodone w/ Acetaminophen Tab 5-325 MG: ORAL | Qty: 6 | Status: AC

## 2019-10-03 ENCOUNTER — Other Ambulatory Visit: Payer: Self-pay | Admitting: Family Medicine

## 2019-10-05 ENCOUNTER — Other Ambulatory Visit: Payer: Self-pay | Admitting: Family Medicine

## 2019-10-07 ENCOUNTER — Other Ambulatory Visit: Payer: Self-pay

## 2019-10-07 MED ORDER — ATORVASTATIN CALCIUM 40 MG PO TABS: 40.0000 mg | ORAL_TABLET | Freq: Every day | ORAL | 1 refills | Status: DC

## 2019-10-08 ENCOUNTER — Encounter: Payer: Self-pay | Admitting: Family Medicine

## 2019-10-11 ENCOUNTER — Telehealth: Payer: Self-pay

## 2019-10-11 NOTE — Telephone Encounter (Signed)
Pt called back and is scheduled for a nurse visit 11/22/19 at 4:00.

## 2019-10-11 NOTE — Telephone Encounter (Signed)
Correction, lmom letting pt know that our office will call him to schedule his apt. Routing to AS.

## 2019-10-11 NOTE — Telephone Encounter (Signed)
Lmom, waiting on a return call.  

## 2019-10-11 NOTE — Telephone Encounter (Signed)
Pt called to schedule a TCS. Pt's last TCS was in 2015 and per RMR notes, pt needed another TCS in 5 years. Pt hasn't been seen in the office previously. Pt only scheduled TCS with RMR in the past. Please advise if pt would need an apt prior to scheduling TCS.

## 2019-10-11 NOTE — Telephone Encounter (Signed)
Lmom for pt to call us back.  Pt needs nurse visit.

## 2019-10-11 NOTE — Telephone Encounter (Signed)
He needs a good thorough triage.  If any triggers for face-to-face encounter would recommend that.  Otherwise, we can go directly to colonoscopy without an office visit if appropriate.

## 2019-10-13 ENCOUNTER — Other Ambulatory Visit: Payer: Self-pay

## 2019-10-13 MED ORDER — ATORVASTATIN CALCIUM 40 MG PO TABS: 40.0000 mg | ORAL_TABLET | Freq: Every day | ORAL | 1 refills | Status: DC

## 2019-10-14 ENCOUNTER — Other Ambulatory Visit: Payer: Self-pay

## 2019-10-14 ENCOUNTER — Ambulatory Visit: Payer: BC Managed Care – PPO | Admitting: Family Medicine

## 2019-10-14 VITALS — BP 118/78 | HR 85 | Temp 97.3°F | Ht 71.0 in | Wt 329.0 lb

## 2019-10-14 DIAGNOSIS — E118 Type 2 diabetes mellitus with unspecified complications: Secondary | ICD-10-CM | POA: Diagnosis not present

## 2019-10-14 DIAGNOSIS — E7800 Pure hypercholesterolemia, unspecified: Secondary | ICD-10-CM | POA: Diagnosis not present

## 2019-10-14 DIAGNOSIS — N400 Enlarged prostate without lower urinary tract symptoms: Secondary | ICD-10-CM

## 2019-10-14 DIAGNOSIS — I10 Essential (primary) hypertension: Secondary | ICD-10-CM | POA: Diagnosis not present

## 2019-10-14 NOTE — Progress Notes (Signed)
Subjective:    Patient ID: Eric Johnson, male    DOB: [birth year], 59 y.o.   MRN: [id number]  HPI  Patient is a very pleasant 59 year old Caucasian male here today for a follow-up.  Recently he was in the emergency room due to a kidney stone.  At that time he had a CAT scan which had several coincidental findings.  IMPRESSION: 1. Obstructing 3 x 4 mm stone at the right ureterovesicular junction with moderate hydronephrosis and perinephric edema. 2. Additional nonobstructing stone in the lower right kidney. 3. Cholelithiasis without gallbladder inflammation. 4. Colonic diverticulosis without diverticulitis. 5. Enlarged prostate gland causing mild mass effect on the bladder base. Patient had mild aortobiiliac arterial sclerosis.  He also had asymptomatic cholelithiasis.  He also had a markedly enlarged prostate gland although his PSA in November was 0.6.  He denies any lower urinary tract symptoms.  He states that he usually wakes up once at night to urinate.  He denies any hematuria or dysuria.  I recommended a digital rectal exam to evaluate for nodularity however he declines this at the present time.  He would prefer to repeat the PSA.  Otherwise he is asymptomatic.  His blood pressure today is well controlled at 118/78.  Unfortunately he continues to smoke cigars.  I explained to the patient that I am concerned about his smoking potentially worsening his atherosclerosis especially given his underlying history of diabetes.  He denies any polyuria polydipsia or blurry vision.  He denies any chest pain or shortness of breath or dyspnea on exertion  Past Medical History:  Diagnosis Date  . Bronchitis   . Diabetes mellitus   . Hypercholesteremia   . Hypertension   . Kidney stones   . Morbid obesity ([place]) 10/30/2012   Past Surgical History:  Procedure Laterality Date  . COLONOSCOPY N/A 08/27/2013   Procedure: COLONOSCOPY;  Surgeon: Daneil Dolin, MD;  Location: [facility];  Service:  Endoscopy;  Laterality: N/A;  11:30 AM  . COLONOSCOPY W/ POLYPECTOMY  2000  . kidney stone removal     Current Outpatient Medications on File Prior to Visit  Medication Sig Dispense Refill  . aspirin 81 MG chewable tablet Chew 81 mg by mouth daily.    Marland Kitchen atorvastatin (LIPITOR) 40 MG tablet Take 1 tablet (40 mg total) by mouth daily. 90 tablet 1  . beta carotene w/minerals (OCUVITE) tablet Take 1 tablet by mouth daily.    . clindamycin (CLEOCIN) 300 MG capsule Take 1 capsule (300 mg total) by mouth 3 (three) times daily. 30 capsule 0  . Cyanocobalamin (VITAMIN B 12 PO) Take by mouth.    . hydrochlorothiazide (HYDRODIURIL) 25 MG tablet Take 1 tablet by mouth once daily 90 tablet 3  . metFORMIN (GLUCOPHAGE) 500 MG tablet TAKE 2 TABLETS(1000 MG) BY MOUTH TWICE DAILY WITH A MEAL 120 tablet 2  . Omega-3 Fatty Acids (FISH OIL) 1200 MG CPDR Take by mouth.    . oxyCODONE-acetaminophen (PERCOCET) 5-325 MG tablet Take 1 tablet by mouth every 4 (four) hours as needed. 6 tablet 0  . oxyCODONE-acetaminophen (PERCOCET) 5-325 MG tablet Take 1-2 tablets by mouth every 4 (four) hours as needed. 20 tablet 0  . saw palmetto 160 MG capsule Take 160 mg by mouth daily.     . tamsulosin (FLOMAX) 0.4 MG CAPS capsule Take 1 capsule (0.4 mg total) by mouth daily. 10 capsule 0   No current facility-administered medications on file prior to visit.   Allergies  Allergen Reactions  . Losartan Swelling  . Penicillins    Social History   Socioeconomic History  . Marital status: Married    Spouse name: Not on file  . Number of children: Not on file  . Years of education: Not on file  . Highest education level: Not on file  Occupational History  . Not on file  Tobacco Use  . Smoking status: Current Some Day Smoker    Years: 5.00    Types: Cigars  . Smokeless tobacco: Former Systems developer    Types: Snuff  Substance and Sexual Activity  . Alcohol use: Yes    Alcohol/week: 0.0 standard drinks    Comment: Occ wine  .  Drug use: No  . Sexual activity: Not on file  Other Topics Concern  . Not on file  Social History Narrative  . Not on file   Social Determinants of Health   Financial Resource Strain:   . Difficulty of Paying Living Expenses:   Food Insecurity:   . Worried About Charity fundraiser in the Last Year:   . Arboriculturist in the Last Year:   Transportation Needs:   . Film/video editor (Medical):   Marland Kitchen Lack of Transportation (Non-Medical):   Physical Activity:   . Days of Exercise per Week:   . Minutes of Exercise per Session:   Stress:   . Feeling of Stress :   Social Connections:   . Frequency of Communication with Friends and Family:   . Frequency of Social Gatherings with Friends and Family:   . Attends Religious Services:   . Active Member of Clubs or Organizations:   . Attends Archivist Meetings:   Marland Kitchen Marital Status:   Intimate Partner Violence:   . Fear of Current or Ex-Partner:   . Emotionally Abused:   Marland Kitchen Physically Abused:   . Sexually Abused:    Family History  Problem Relation Age of Onset  . Heart Problems Mother       Review of Systems  All other systems reviewed and are negative.      Objective:   Physical Exam Vitals reviewed.  Constitutional:      General: He is not in acute distress.    Appearance: He is well-developed. He is not diaphoretic.  HENT:     Head: Normocephalic and atraumatic.     Right Ear: External ear normal.     Left Ear: External ear normal.     Nose: Nose normal.     Mouth/Throat:     Pharynx: No oropharyngeal exudate.  Eyes:     General: No scleral icterus.       Right eye: No discharge.        Left eye: No discharge.     Conjunctiva/sclera: Conjunctivae normal.     Pupils: Pupils are equal, round, and reactive to light.  Neck:     Thyroid: No thyromegaly.     Vascular: No JVD.     Trachea: No tracheal deviation.  Cardiovascular:     Rate and Rhythm: Normal rate and regular rhythm.     Heart sounds:  Normal heart sounds. No murmur heard.  No friction rub. No gallop.   Pulmonary:     Effort: Pulmonary effort is normal. No respiratory distress.     Breath sounds: Normal breath sounds. No stridor. No wheezing or rales.  Chest:     Chest wall: No tenderness.  Abdominal:     General: Bowel sounds  are normal. There is no distension.     Palpations: Abdomen is soft. There is no mass.     Tenderness: There is no abdominal tenderness. There is no guarding or rebound.  Musculoskeletal:        General: No tenderness or deformity. Normal range of motion.     Cervical back: Normal range of motion and neck supple.  Lymphadenopathy:     Cervical: No cervical adenopathy.  Neurological:     Mental Status: He is alert and oriented to person, place, and time.     Cranial Nerves: No cranial nerve deficit.     Motor: No abnormal muscle tone.     Coordination: Coordination normal.     Deep Tendon Reflexes: Reflexes are normal and symmetric.  Psychiatric:        Behavior: Behavior normal.        Thought Content: Thought content normal.        Judgment: Judgment normal.          Assessment & Plan:  Diabetes mellitus with complication in adult patient ([place]) - Plan: COMPLETE METABOLIC PANEL WITH GFR, Lipid panel, Microalbumin, urine, Hemoglobin A1c  Benign essential HTN  Hypercholesteremia  Morbid obesity ([facility])  Benign prostatic hyperplasia without lower urinary tract symptoms - Plan: PSA  prostate gland was enlarged on the CAT scan however despite that he has a normal PSA in November and no lower urinary tract symptoms.  He declines a digital rectal exam today however I will repeat a PSA to see if there has been a dramatic increase in his PSA since November.  I explained that my biggest concerns for his long-term health would be his weight and also his smoking.  These were the biggest risk factors for progression of his atherosclerosis.  I recommended dietary and lifestyle changes to achieve  weight loss including exercise.  I also strongly recommended smoking cessation.  Patient is already taking a low-dose aspirin.  I will check a CMP, fasting lipid panel, urine microalbumin, and a hemoglobin A1c.  Goal LDL cholesterol is less than 100 and is close to 70 as possible.  Goal A1c is less than 6.5.

## 2019-10-15 LAB — COMPLETE METABOLIC PANEL WITH GFR
AG Ratio: 1.8 (calc) (ref 1.0–2.5)
ALT: 28 U/L (ref 9–46)
AST: 20 U/L (ref 10–35)
Albumin: 4.5 g/dL (ref 3.6–5.1)
Alkaline phosphatase (APISO): 60 U/L (ref 35–144)
BUN: 16 mg/dL (ref 7–25)
CO2: 27 mmol/L (ref 20–32)
Calcium: 9.7 mg/dL (ref 8.6–10.3)
Chloride: 102 mmol/L (ref 98–110)
Creat: 0.89 mg/dL (ref 0.70–1.25)
GFR, Est African American: 108 mL/min/{1.73_m2} (ref >=60)
GFR, Est Non African American: 93 mL/min/{1.73_m2} (ref >=60)
Globulin: 2.5 g/dL (calc) (ref 1.9–3.7)
Glucose, Bld: 126 mg/dL — ABNORMAL HIGH (ref 65–99)
Potassium: 3.9 mmol/L (ref 3.5–5.3)
Sodium: 140 mmol/L (ref 135–146)
Total Bilirubin: 0.5 mg/dL (ref 0.2–1.2)
Total Protein: 7 g/dL (ref 6.1–8.1)

## 2019-10-15 LAB — HEMOGLOBIN A1C
Hgb A1c MFr Bld: 6.4 % of total Hgb — ABNORMAL HIGH (ref <5.7)
Mean Plasma Glucose: 137 (calc)
eAG (mmol/L): 7.6 (calc)

## 2019-10-15 LAB — PSA: PSA: 0.6 ng/mL (ref <=4.0)

## 2019-10-15 LAB — MICROALBUMIN, URINE: Microalb, Ur: 0.7 mg/dL

## 2019-10-19 ENCOUNTER — Other Ambulatory Visit: Payer: BC Managed Care – PPO

## 2019-10-19 ENCOUNTER — Other Ambulatory Visit: Payer: Self-pay

## 2019-10-19 DIAGNOSIS — E118 Type 2 diabetes mellitus with unspecified complications: Secondary | ICD-10-CM | POA: Diagnosis not present

## 2019-10-20 LAB — LIPID PANEL
Cholesterol: 106 mg/dL (ref <200)
HDL: 34 mg/dL — ABNORMAL LOW (ref >=40)
LDL Cholesterol (Calc): 48 mg/dL (calc)
Non-HDL Cholesterol (Calc): 72 mg/dL (calc) (ref <130)
Total CHOL/HDL Ratio: 3.1 (calc) (ref <5.0)
Triglycerides: 154 mg/dL — ABNORMAL HIGH (ref <150)

## 2019-11-22 ENCOUNTER — Ambulatory Visit: Payer: BC Managed Care – PPO

## 2019-12-20 DIAGNOSIS — M1712 Unilateral primary osteoarthritis, left knee: Secondary | ICD-10-CM | POA: Diagnosis not present

## 2019-12-20 DIAGNOSIS — M25562 Pain in left knee: Secondary | ICD-10-CM | POA: Diagnosis not present

## 2019-12-20 DIAGNOSIS — M1711 Unilateral primary osteoarthritis, right knee: Secondary | ICD-10-CM | POA: Diagnosis not present

## 2019-12-20 DIAGNOSIS — M25571 Pain in right ankle and joints of right foot: Secondary | ICD-10-CM | POA: Diagnosis not present

## 2019-12-29 ENCOUNTER — Ambulatory Visit (INDEPENDENT_AMBULATORY_CARE_PROVIDER_SITE_OTHER): Payer: Self-pay | Admitting: *Deleted

## 2019-12-29 ENCOUNTER — Other Ambulatory Visit: Payer: Self-pay

## 2019-12-29 DIAGNOSIS — Z86010 Personal history of colonic polyps: Secondary | ICD-10-CM

## 2019-12-29 MED ORDER — NA SULFATE-K SULFATE-MG SULF 17.5-3.13-1.6 GM/177ML PO SOLN: 1.0000 | Freq: Once | ORAL | 0 refills | Status: AC

## 2019-12-29 NOTE — Progress Notes (Signed)
Gastroenterology Pre-Procedure Review  Request Date: 12/29/2019 Requesting Physician: 5 year recall, Last TCS 08/27/2013 done by Dr. Gala Romney, tubular adenoma  PATIENT REVIEW QUESTIONS: The patient responded to the following health history questions as indicated:    1. Diabetes Melitis: yes 2. Joint replacements in the past 12 months: no 3. Major health problems in the past 3 months: no 4. Has an artificial valve or MVP: no 5. Has a defibrillator: no 6. Has been advised in past to take antibiotics in advance of a procedure like teeth cleaning: no 7. Family history of colon cancer: no  8. Alcohol Use: yes, 2 glasses of wine a month 9. Illicit drug Use: no 10. History of sleep apnea: no  11. History of coronary artery or other vascular stents placed within the last 12 months: no 12. History of any prior anesthesia complications: yes, woke up during procedure 13. There is no height or weight on file to calculate BMI. ht: 5'11 wt: 295-300 lbs    MEDICATIONS & ALLERGIES:    Patient reports the following regarding taking any blood thinners:   Plavix? no Aspirin? yes Coumadin? no Brilinta? no Xarelto? no Eliquis? no Pradaxa? no Savaysa? no Effient? no  Patient confirms/reports the following medications:  Current Outpatient Medications  Medication Sig Dispense Refill  . aspirin 81 MG chewable tablet Chew 81 mg by mouth daily.    Marland Kitchen atorvastatin (LIPITOR) 40 MG tablet Take 1 tablet (40 mg total) by mouth daily. 90 tablet 1  . beta carotene w/minerals (OCUVITE) tablet Take 1 tablet by mouth daily.    . Cyanocobalamin (VITAMIN B 12 PO) Take by mouth daily.     . hydrochlorothiazide (HYDRODIURIL) 25 MG tablet Take 1 tablet by mouth once daily 90 tablet 3  . metFORMIN (GLUCOPHAGE) 500 MG tablet TAKE 2 TABLETS(1000 MG) BY MOUTH TWICE DAILY WITH A MEAL 120 tablet 2  . Multiple Vitamins-Minerals (MULTIVITAMIN MEN 50+) TABS Take by mouth daily.    . Omega-3 Fatty Acids (FISH OIL) 1200 MG CPDR  Take by mouth daily. Takes 1030 mg daily.    . saw palmetto 160 MG capsule Take 160 mg by mouth daily.     . Na Sulfate-K Sulfate-Mg Sulf 17.5-3.13-1.6 GM/177ML SOLN Take 1 kit by mouth once for 1 dose. (Patient not taking: Reported on 12/29/2019) 354 mL 0   No current facility-administered medications for this visit.    Patient confirms/reports the following allergies:  Allergies  Allergen Reactions  . Losartan Swelling  . Penicillins     No orders of the defined types were placed in this encounter.   AUTHORIZATION INFORMATION Primary Insurance: [place],  [place] #: [id number],  Group #: [id number] Pre-Cert / Josem Kaufmann required: No, not required  SCHEDULE INFORMATION: Procedure has been scheduled as follows:  Date: , Time:  Location: [facility] with Dr. Gala Romney  This Gastroenterology Pre-Precedure Review Form is being routed to the following provider(s): Roseanne Kaufman, NP

## 2019-12-29 NOTE — Progress Notes (Signed)
Pt made aware that I would call him once December procedure schedule available if triage approved by provider.

## 2019-12-29 NOTE — Patient Instructions (Signed)
Eric Johnson  [birth year] MRN: [id number]     Procedure Date:  Time to register:  Place to register: [facility] Procedure Time:  Scheduled provider: Dr. Gala Romney    PREPARATION FOR COLONOSDCOPY WITH SUPREP BOWEL PREP KIT  Note: Suprep Bowel Prep Kit is a split-dose (2day) regimen. Consumption of BOTH 6-ounce bottles is required for a complete prep.  Please notify us immediately if you are diabetic, take iron supplements, or if you are on Coumadin or any other blood thinners.  Please hold the following medications: See letter                                                                                                                                                 2 DAYS BEFORE PROCEDURE:  DATE:    DAY:  Begin clear liquid diet AFTER your lunch meal. NO SOLID FOODS after this point.  1 DAY BEFORE PROCEDURE:  DATE:    DAY:  Continue clear liquids the entire day - NO SOLID FOOD.   Diabetic medications adjustments for today: See letter  At 6:00pm: Complete steps 1 through 4 below, using ONE (1) 6-ounce bottle, before going to bed. Step 1:  Pour ONE (1) 6-ounce bottle of SUPREP liquid into the mixing container.  Step 2:  Add cool drinking water to the 16 ounce line on the container and mix.  Note: Dilute the solution concentrate as directed prior to use. Step 3:  DRINK ALL the liquid in the container. Step 4:  You MUST drink an additional two (2) or more 16 ounce containers of water over the next one (1) hour.   Continue clear liquids.  DAY OF PROCEDURE:   DATE:    DAY:  If you take medications for your heart, blood pressure, or breathing, you may take these medications.  Diabetic medications adjustments for today: See letter  5 hours before your procedure at :   Step 1:  Pour ONE (1) 6-ounce bottle of SUPREP liquid into the mixing container.  Step 2:  Add cool drinking water to the 16 ounce line on the container and mix.  Note: Dilute the solution concentrate as  directed prior to use. Step 3:  DRINK ALL the liquid in the container. Step 4:  You MUST drink an additional two (2) or more 16 ounce containers of water over the next one (1) hour. You MUST complete the final glass of water at least 3 hours before your colonoscopy. Nothing by mouth past    You may take your morning medications with sip of water unless we have instructed otherwise.    Please see below for Dietary Information.  CLEAR LIQUIDS INCLUDE:  Water Jello (NOT red in color)   Ice Popsicles (NOT red in color)   Tea (sugar ok, no milk/cream) Powdered fruit flavored drinks  Coffee (sugar ok,  no milk/cream) Gatorade/ Lemonade/ Kool-Aid  (NOT red in color)   Juice: apple, white grape, white cranberry Soft drinks  Clear bullion, consomme, broth (fat free beef/chicken/vegetable)  Carbonated beverages (any kind)  Strained chicken noodle soup Hard Candy   Remember: Clear liquids are liquids that will allow you to see your fingers on the other side of a clear glass. Be sure liquids are NOT red in color, and not cloudy, but CLEAR.  DO NOT EAT OR DRINK ANY OF THE FOLLOWING:  Dairy products of any kind   Cranberry juice Tomato juice / V8 juice   Grapefruit juice Orange juice     Red grape juice  Do not eat any solid foods, including such foods as: cereal, oatmeal, yogurt, fruits, vegetables, creamed soups, eggs, bread, crackers, pureed foods in a blender, etc.   HELPFUL HINTS FOR DRINKING PREP SOLUTION:   Make sure prep is extremely cold. Mix and refrigerate the the morning of the prep. You may also put in the freezer.   You may try mixing some Crystal Light or Country Time Lemonade if you prefer. Mix in small amounts; add more if necessary.  Try drinking through a straw  Rinse mouth with water or a mouthwash between glasses, to remove after-taste.  Try sipping on a cold beverage /ice/ popsicles between glasses of prep.  Place a piece of sugar-free hard candy in mouth between  glasses.  If you become nauseated, try consuming smaller amounts, or stretch out the time between glasses. Stop for 30-60 minutes, then slowly start back drinking.     OTHER INSTRUCTIONS  You will need a responsible adult at least 61 years of age to accompany you and drive you home. This person must remain in the waiting room during your procedure. The hospital will cancel your procedure if you do not have a responsible adult with you.   1. Wear loose fitting clothing that is easily removed. 2. Leave jewelry and other valuables at home.  3. Remove all body piercing jewelry and leave at home. 4. Total time from sign-in until discharge is approximately 2-3 hours. 5. You should go home directly after your procedure and rest. You can resume normal activities the day after your procedure. 6. The day of your procedure you should not:  Drive  Make legal decisions  Operate machinery  Drink alcohol  Return to work   You may call the office (Dept: [phone]) before 5:00pm, or page the doctor on call [phone]) after 5:00pm, for further instructions, if necessary.   Insurance Information YOU WILL NEED TO CHECK WITH YOUR INSURANCE COMPANY FOR THE BENEFITS OF COVERAGE YOU HAVE FOR THIS PROCEDURE.  UNFORTUNATELY, NOT ALL INSURANCE COMPANIES HAVE BENEFITS TO COVER ALL OR PART OF THESE TYPES OF PROCEDURES.  IT IS YOUR RESPONSIBILITY TO CHECK YOUR BENEFITS, HOWEVER, WE WILL BE GLAD TO ASSIST YOU WITH ANY CODES YOUR INSURANCE COMPANY MAY NEED.    PLEASE NOTE THAT MOST INSURANCE COMPANIES WILL NOT COVER A SCREENING COLONOSCOPY FOR PEOPLE UNDER THE AGE OF 50  IF YOU HAVE BCBS INSURANCE, YOU MAY HAVE BENEFITS FOR A SCREENING COLONOSCOPY BUT IF POLYPS ARE FOUND THE DIAGNOSIS WILL CHANGE AND THEN YOU MAY HAVE A DEDUCTIBLE THAT WILL NEED TO BE MET. SO PLEASE MAKE SURE YOU CHECK YOUR BENEFITS FOR A SCREENING COLONOSCOPY AS WELL AS A DIAGNOSTIC COLONOSCOPY.

## 2020-01-05 NOTE — Progress Notes (Signed)
Needs office visit for Propofol as he states he woke up during procedure. Which procedure was this?

## 2020-01-06 ENCOUNTER — Other Ambulatory Visit: Payer: Self-pay | Admitting: Family Medicine

## 2020-01-06 ENCOUNTER — Telehealth: Payer: Self-pay | Admitting: *Deleted

## 2020-01-06 DIAGNOSIS — I10 Essential (primary) hypertension: Secondary | ICD-10-CM

## 2020-01-06 MED ORDER — METFORMIN HCL 500 MG PO TABS: ORAL_TABLET | ORAL | 2 refills | Status: DC

## 2020-01-06 NOTE — Telephone Encounter (Signed)
Lmom for pt to call back.  Needs ov.

## 2020-01-06 NOTE — Telephone Encounter (Signed)
Pharmacy:[facility] [place]  Medication:metFORMIN (GLUCOPHAGE) 500 MG tablet   Qty:120  TXL:EZVG 2 TABLETS(1000 MG) BY MOUTH TWICE DAILY WITH A MEAL  Physician: Dr. Dennard Schaumann

## 2020-01-07 NOTE — Progress Notes (Signed)
Called pt and made him aware that he needed ov to arrange procedure due to waking up during his procedure.  Pt scheduled ov for 02/18/2020 at 8:00 with Neil Crouch, PA.    Roseanne Kaufman, NP:  Pt stated the procedure he had was a TCS about 5 to 6 years ago.

## 2020-01-28 ENCOUNTER — Encounter: Payer: Self-pay | Admitting: Family Medicine

## 2020-02-18 ENCOUNTER — Other Ambulatory Visit: Payer: Self-pay

## 2020-02-18 ENCOUNTER — Ambulatory Visit: Payer: BC Managed Care – PPO | Admitting: Gastroenterology

## 2020-02-18 ENCOUNTER — Encounter: Payer: Self-pay | Admitting: Gastroenterology

## 2020-02-18 ENCOUNTER — Encounter: Payer: Self-pay | Admitting: *Deleted

## 2020-02-18 DIAGNOSIS — Z86010 Personal history of colonic polyps: Secondary | ICD-10-CM

## 2020-02-18 DIAGNOSIS — T8852XA Failed moderate sedation during procedure, initial encounter: Secondary | ICD-10-CM | POA: Insufficient documentation

## 2020-02-18 DIAGNOSIS — T8852XS Failed moderate sedation during procedure, sequela: Secondary | ICD-10-CM | POA: Diagnosis not present

## 2020-02-18 NOTE — Patient Instructions (Signed)
1. Colonoscopy as scheduled. See separate instructions.  

## 2020-02-18 NOTE — Progress Notes (Signed)
Primary Care Physician:  [name], MD  Primary Gastroenterologist:  Garfield Cornea, MD   Chief Complaint  Patient presents with  . hx polyps    needs TCS    HPI:  Eric Johnson is a 59 y.o. male here to schedule surveillance colonoscopy.  His last colonoscopy was in May 2015, multiple tubular adenomas removed.  Advised 5-year follow-up colonoscopy.  Patient reports waking up during his procedure, was somewhat uncomfortable for a few minutes before going back to sleep.  He prefers not to wake up during his test.    From a GI standpoint he is doing well.  BM regular. No melena, brbpr.  No abdominal pain.  No upper GI symptoms.    Current Outpatient Medications  Medication Sig Dispense Refill  . aspirin 81 MG chewable tablet Chew 81 mg by mouth daily.    Marland Kitchen atorvastatin (LIPITOR) 40 MG tablet Take 1 tablet (40 mg total) by mouth daily. 90 tablet 1  . beta carotene w/minerals (OCUVITE) tablet Take 1 tablet by mouth daily.    . Cyanocobalamin (VITAMIN B 12 PO) Take by mouth daily.     . hydrochlorothiazide (HYDRODIURIL) 25 MG tablet TAKE 1 TABLET BY MOUTH DAILY 90 tablet 3  . metFORMIN (GLUCOPHAGE) 500 MG tablet TAKE 2 TABLETS(1000 MG) BY MOUTH TWICE DAILY WITH A MEAL 120 tablet 2  . Multiple Vitamins-Minerals (MULTIVITAMIN MEN 50+) TABS Take by mouth daily.    . Omega-3 Fatty Acids (FISH OIL) 1200 MG CPDR Take by mouth daily. Takes 1030 mg daily.    . saw palmetto 160 MG capsule Take 160 mg by mouth daily.      No current facility-administered medications for this visit.    Allergies as of 02/18/2020 - Review Complete 02/18/2020  Allergen Reaction Noted  . Losartan Swelling 12/11/2015  . Penicillins  06/28/2011    Past Medical History:  Diagnosis Date  . Bronchitis   . Diabetes mellitus   . Hypercholesteremia   . Hypertension   . Kidney stones   . Morbid obesity ([place]) 10/30/2012    Past Surgical History:  Procedure Laterality Date  . COLONOSCOPY N/A 08/27/2013    Dr. Gala Romney: Multiple tubular adenomas removed.  Next colonoscopy 5 years.  . COLONOSCOPY W/ POLYPECTOMY  2000  . kidney stone removal      Family History  Problem Relation Age of Onset  . Heart Problems Mother   . Colon cancer Neg Hx     Social History   Socioeconomic History  . Marital status: Married    Spouse name: Not on file  . Number of children: Not on file  . Years of education: Not on file  . Highest education level: Not on file  Occupational History  . Not on file  Tobacco Use  . Smoking status: Current Some Day Smoker    Years: 5.00    Types: Cigars  . Smokeless tobacco: Former Systems developer    Types: Snuff  Substance and Sexual Activity  . Alcohol use: Yes    Alcohol/week: 0.0 standard drinks    Comment: Occ wine  . Drug use: No  . Sexual activity: Not on file  Other Topics Concern  . Not on file  Social History Narrative  . Not on file   Social Determinants of Health   Financial Resource Strain:   . Difficulty of Paying Living Expenses: Not on file  Food Insecurity:   . Worried About Charity fundraiser in the Last Year: Not  on file  . Ran Out of Food in the Last Year: Not on file  Transportation Needs:   . Lack of Transportation (Medical): Not on file  . Lack of Transportation (Non-Medical): Not on file  Physical Activity:   . Days of Exercise per Week: Not on file  . Minutes of Exercise per Session: Not on file  Stress:   . Feeling of Stress : Not on file  Social Connections:   . Frequency of Communication with Friends and Family: Not on file  . Frequency of Social Gatherings with Friends and Family: Not on file  . Attends Religious Services: Not on file  . Active Member of Clubs or Organizations: Not on file  . Attends Archivist Meetings: Not on file  . Marital Status: Not on file  Intimate Partner Violence:   . Fear of Current or Ex-Partner: Not on file  . Emotionally Abused: Not on file  . Physically Abused: Not on file  . Sexually  Abused: Not on file      ROS:  General: Negative for anorexia, weight loss, fever, chills, fatigue, weakness. Eyes: Negative for vision changes.  ENT: Negative for hoarseness, difficulty swallowing , nasal congestion. CV: Negative for chest pain, angina, palpitations, dyspnea on exertion, peripheral edema.  Respiratory: Negative for dyspnea at rest, dyspnea on exertion, cough, sputum, wheezing.  GI: See history of present illness. GU:  Negative for dysuria, hematuria, urinary incontinence, urinary frequency, nocturnal urination.  MS: Negative for joint pain, low back pain.  Derm: Negative for rash or itching.  Neuro: Negative for weakness, abnormal sensation, seizure, frequent headaches, memory loss, confusion.  Psych: Negative for anxiety, depression, suicidal ideation, hallucinations.  Endo: Negative for unusual weight change.  Heme: Negative for bruising or bleeding. Allergy: Negative for rash or hives.    Physical Examination:  BP (!) 143/93   Pulse 87   Temp 97.7 F (36.5 C)   Ht 5\' 11"  (1.803 m)   Wt (!) 325 lb 12.8 oz (147.8 kg)   BMI 45.44 kg/m    General: Well-nourished, well-developed in no acute distress.  Head: Normocephalic, atraumatic.   Eyes: Conjunctiva pink, no icterus. Mouth: masked Neck: Supple without thyromegaly, masses, or lymphadenopathy.  Lungs: Clear to auscultation bilaterally.  Heart: Regular rate and rhythm, no murmurs rubs or gallops.  Abdomen: Bowel sounds are normal, nontender, nondistended, no hepatosplenomegaly or masses, no abdominal bruits or    hernia , no rebound or guarding.   Rectal: Not performed Extremities: No lower extremity edema. No clubbing or deformities.  Neuro: Alert and oriented x 4 , grossly normal neurologically.  Skin: Warm and dry, no rash or jaundice.   Psych: Alert and cooperative, normal mood and affect.  Labs: Lab Results  Component Value Date   CREATININE 0.89 10/14/2019   BUN 16 10/14/2019   NA 140  10/14/2019   K 3.9 10/14/2019   CL 102 10/14/2019   CO2 27 10/14/2019   Lab Results  Component Value Date   ALT 28 10/14/2019   AST 20 10/14/2019   ALKPHOS 51 08/09/2016   BILITOT 0.5 10/14/2019   Lab Results  Component Value Date   WBC 11.9 (H) 09/03/2019   HGB 15.3 09/03/2019   HCT 44.7 09/03/2019   MCV 95.7 09/03/2019   PLT 235 09/03/2019   Lab Results  Component Value Date   HGBA1C 6.4 (H) 10/14/2019     Imaging Studies: No results found.  Impression/plan:  59 year old male with history of multiple  tubular adenomas removed at time of his last colonoscopy in 2015 presenting to schedule surveillance exam.  Denies any GI symptoms.  He reports waking up during his last study, conscious sedation provided at that time.  Plan for deep sedation with propofol. ASA III.  I have discussed the risks, alternatives, benefits with regards to but not limited to the risk of reaction to medication, bleeding, infection, perforation and the patient is agreeable to proceed. Written consent to be obtained.

## 2020-02-21 ENCOUNTER — Encounter: Payer: Self-pay | Admitting: *Deleted

## 2020-02-23 ENCOUNTER — Encounter: Payer: Self-pay | Admitting: *Deleted

## 2020-02-23 ENCOUNTER — Telehealth: Payer: Self-pay | Admitting: *Deleted

## 2020-02-23 NOTE — Telephone Encounter (Signed)
Called pt. Offered sooner procedure date/time for 12/23. He agree'd. Procedure moved up. Advised will mail new prep instructions with new covid/pre-op appt. Called endo and LMOVM making aware of change.

## 2020-03-20 ENCOUNTER — Ambulatory Visit (INDEPENDENT_AMBULATORY_CARE_PROVIDER_SITE_OTHER): Payer: BC Managed Care – PPO | Admitting: Family Medicine

## 2020-03-20 ENCOUNTER — Other Ambulatory Visit: Payer: Self-pay

## 2020-03-20 VITALS — BP 140/84 | HR 87 | Temp 98.6°F | Ht 71.0 in | Wt 323.0 lb

## 2020-03-20 DIAGNOSIS — Z125 Encounter for screening for malignant neoplasm of prostate: Secondary | ICD-10-CM

## 2020-03-20 DIAGNOSIS — E7800 Pure hypercholesterolemia, unspecified: Secondary | ICD-10-CM

## 2020-03-20 DIAGNOSIS — Z0001 Encounter for general adult medical examination with abnormal findings: Secondary | ICD-10-CM

## 2020-03-20 DIAGNOSIS — E118 Type 2 diabetes mellitus with unspecified complications: Secondary | ICD-10-CM | POA: Diagnosis not present

## 2020-03-20 DIAGNOSIS — I10 Essential (primary) hypertension: Secondary | ICD-10-CM | POA: Diagnosis not present

## 2020-03-20 DIAGNOSIS — Z0000 Encounter for general adult medical examination without abnormal findings: Secondary | ICD-10-CM

## 2020-03-20 NOTE — Patient Instructions (Addendum)
Your procedure is scheduled on: 03/23/2020  Report to [facility] at   7:30  AM.  Call this number if you have problems the morning of surgery: [phone]   Remember:              Follow Directions on the letter you received from Your Physician's office regarding the Bowel Prep              No Smoking the day of Procedure :   Take these medicines the morning of surgery with A SIP OF WATER: none   Do not wear jewelry, make-up or nail polish.    Do not bring valuables to the hospital.  Contacts, dentures or bridgework may not be worn into surgery.  .   Patients discharged the day of surgery will not be allowed to drive home.     Colonoscopy, Adult, Care After This sheet gives you information about how to care for yourself after your procedure. Your health care provider may also give you more specific instructions. If you have problems or questions, contact your health care provider. What can I expect after the procedure? After the procedure, it is common to have:  A small amount of blood in your stool for 24 hours after the procedure.  Some gas.  Mild abdominal cramping or bloating.  Follow these instructions at home: General instructions   For the first 24 hours after the procedure: ? Do not drive or use machinery. ? Do not sign important documents. ? Do not drink alcohol. ? Do your regular daily activities at a slower pace than normal. ? Eat soft, easy-to-digest foods. ? Rest often.  Take over-the-counter or prescription medicines only as told by your health care provider.  It is up to you to get the results of your procedure. Ask your health care provider, or the department performing the procedure, when your results will be ready. Relieving cramping and bloating  Try walking around when you have cramps or feel bloated.  Apply heat to your abdomen as told by your health care provider. Use a heat source that your health care provider recommends, such as a moist  heat pack or a heating pad. ? Place a towel between your skin and the heat source. ? Leave the heat on for 20-30 minutes. ? Remove the heat if your skin turns bright red. This is especially important if you are unable to feel pain, heat, or cold. You may have a greater risk of getting burned. Eating and drinking  Drink enough fluid to keep your urine clear or pale yellow.  Resume your normal diet as instructed by your health care provider. Avoid heavy or fried foods that are hard to digest.  Avoid drinking alcohol for as long as instructed by your health care provider. Contact a health care provider if:  You have blood in your stool 2-3 days after the procedure. Get help right away if:  You have more than a small spotting of blood in your stool.  You pass large blood clots in your stool.  Your abdomen is swollen.  You have nausea or vomiting.  You have a fever.  You have increasing abdominal pain that is not relieved with medicine. This information is not intended to replace advice given to you by your health care provider. Make sure you discuss any questions you have with your health care provider. Document Released: 10/31/2003 Document Revised: 12/11/2015 Document Reviewed: 05/30/2015 Elsevier Interactive Patient Education  [employer].  Colonoscopy, Adult A colonoscopy is a procedure to look at the entire large intestine. This procedure is done using a long, thin, flexible tube that has a camera on the end. You may have a colonoscopy:  As a part of normal colorectal screening.  If you have certain symptoms, such as: ? A low number of red blood cells in your blood (anemia). ? Diarrhea that does not go away. ? Pain in your abdomen. ? Blood in your stool. A colonoscopy can help screen for and diagnose medical problems, including:  Tumors.  Extra tissue that grows where mucus forms (polyps).  Inflammation.  Areas of bleeding. Tell your health care provider  about:  Any allergies you have.  All medicines you are taking, including vitamins, herbs, eye drops, creams, and over-the-counter medicines.  Any problems you or family members have had with anesthetic medicines.  Any blood disorders you have.  Any surgeries you have had.  Any medical conditions you have.  Any problems you have had with having bowel movements.  Whether you are pregnant or may be pregnant. What are the risks? Generally, this is a safe procedure. However, problems may occur, including:  Bleeding.  Damage to your intestine.  Allergic reactions to medicines given during the procedure.  Infection. This is rare. What happens before the procedure? Eating and drinking restrictions Follow instructions from your health care provider about eating or drinking restrictions, which may include:  A few days before the procedure: ? Follow a low-fiber diet. ? Avoid nuts, seeds, dried fruit, raw fruits, and vegetables.  1-3 days before the procedure: ? Eat only gelatin dessert or ice pops. ? Drink only clear liquids, such as water, clear juice, clear broth or bouillon, black coffee or tea, or clear soft drinks or sports drinks. ? Avoid liquids that contain red or purple dye.  The day of the procedure: ? Do not eat solid foods. You may continue to drink clear liquids until up to 2 hours before the procedure. ? Do not eat or drink anything starting 2 hours before the procedure, or within the time period that your health care provider recommends. Bowel prep If you were prescribed a bowel prep to take by mouth (orally) to clean out your colon:  Take it as told by your health care provider. Starting the day before your procedure, you will need to drink a large amount of liquid medicine. The liquid will cause you to have many bowel movements of loose stool until your stool becomes almost clear or light green.  If your skin or the opening between the buttocks (anus) gets  irritated from diarrhea, you may relieve the irritation using: ? Wipes with medicine in them, such as adult wet wipes with aloe and vitamin E. ? A product to soothe skin, such as petroleum jelly.  If you vomit while drinking the bowel prep: ? Take a break for up to 60 minutes. ? Begin the bowel prep again. ? Call your health care provider if you keep vomiting or you cannot take the bowel prep without vomiting.  To clean out your colon, you may also be given: ? Laxative medicines. These help you have a bowel movement. ? Instructions for enema use. An enema is liquid medicine injected into your rectum. Medicines Ask your health care provider about:  Changing or stopping your regular medicines or supplements. This is especially important if you are taking iron supplements, diabetes medicines, or blood thinners.  Taking medicines such as aspirin and ibuprofen. These  medicines can thin your blood. Do not take these medicines unless your health care provider tells you to take them.  Taking over-the-counter medicines, vitamins, herbs, and supplements. General instructions  Ask your health care provider what steps will be taken to help prevent infection. These may include washing skin with a germ-killing soap.  Plan to have someone take you home from the hospital or clinic. What happens during the procedure?   An IV will be inserted into one of your veins.  You may be given one or more of the following: ? A medicine to help you relax (sedative). ? A medicine to numb the area (local anesthetic). ? A medicine to make you fall asleep (general anesthetic). This is rarely needed.  You will lie on your side with your knees bent.  The tube will: ? Have oil or gel put on it (be lubricated). ? Be inserted into your anus. ? Be gently eased through all parts of your large intestine.  Air will be sent into your colon to keep it open. This may cause some pressure or cramping.  Images will be  taken with the camera and will appear on a screen.  A small tissue sample may be removed to be looked at under a microscope (biopsy). The tissue may be sent to a lab for testing if any signs of problems are found.  If small polyps are found, they may be removed and checked for cancer cells.  When the procedure is finished, the tube will be removed. The procedure may vary among health care providers and hospitals. What happens after the procedure?  Your blood pressure, heart rate, breathing rate, and blood oxygen level will be monitored until you leave the hospital or clinic.  You may have a small amount of blood in your stool.  You may pass gas and have mild cramping or bloating in your abdomen. This is caused by the air that was used to open your colon during the exam.  Do not drive for 24 hours after the procedure.  It is up to you to get the results of your procedure. Ask your health care provider, or the department that is doing the procedure, when your results will be ready. Summary  A colonoscopy is a procedure to look at the entire large intestine.  Follow instructions from your health care provider about eating and drinking before the procedure.  If you were prescribed an oral bowel prep to clean out your colon, take it as told by your health care provider.  During the colonoscopy, a flexible tube with a camera on its end is inserted into the anus and then passed into the other parts of the large intestine. This information is not intended to replace advice given to you by your health care provider. Make sure you discuss any questions you have with your health care provider. Document Revised: 10/09/2018 Document Reviewed: 10/09/2018 Elsevier Patient Education  [employer].

## 2020-03-20 NOTE — Progress Notes (Signed)
Subjective:    Patient ID: Eric Johnson, male    DOB: Jan 31, 1961, 59 y.o.   MRN: [id number]  HPI Patient is a very pleasant 59 year old Caucasian male here today for complete physical exam.  Patient's last colonoscopy was in 2015.  At that time he had 3 tubular adenoma polyps.  However he is scheduled his repeat colonoscopy and that will be done actually later this week.  His PSA was last checked in July of this year and was 0.6.  He is not due again for prostate cancer screening until July 2022.  Reviewing his shot records, he has had his Covid vaccines along with a booster, his flu shot, due to smoking he has had Pneumovax 23.  He is due for Shingrix however he would like to check with his insurance company before receiving this.  He has not yet had a sleep study.  We discussed this today and he would like to schedule that but he would like to wait till after the first of the year.  His plate has been full helping to care for his parents.  I will be glad to schedule him for a sleep study but of asked him to call me when he is ready because I realize that his time has been full recently.  Otherwise he is doing well except for his continued smoking.  He is trying to cut back.  His weight is down 6 pounds from this most recent visit in July. Immunization History  Administered Date(s) Administered  . HPV Quadrivalent 01/08/2019  . Influenza Split 04/21/2009  . Influenza,inj,Quad PF,6+ Mos 12/25/2012, 01/03/2017  . Influenza-Unspecified 01/05/2016, 01/02/2018, 01/07/2020  . PFIZER SARS-COV-2 Vaccination 06/12/2019, 07/07/2019  . Pneumococcal Polysaccharide-23 06/13/2015  . Tdap 04/21/2009  . Zoster 10/30/2012    Past Medical History:  Diagnosis Date  . Bronchitis   . Diabetes mellitus   . Hypercholesteremia   . Hypertension   . Kidney stones   . Morbid obesity ([place]) 10/30/2012   Past Surgical History:  Procedure Laterality Date  . COLONOSCOPY N/A 08/27/2013   Dr. Gala Romney: Multiple tubular  adenomas removed.  Next colonoscopy 5 years.  . COLONOSCOPY W/ POLYPECTOMY  2000  . kidney stone removal     Current Outpatient Medications on File Prior to Visit  Medication Sig Dispense Refill  . aspirin 81 MG chewable tablet Chew 81 mg by mouth daily.    Marland Kitchen atorvastatin (LIPITOR) 40 MG tablet Take 1 tablet (40 mg total) by mouth daily. 90 tablet 1  . beta carotene w/minerals (OCUVITE) tablet Take 1 tablet by mouth daily.    . Cyanocobalamin (VITAMIN B 12) 500 MCG TABS Take 500 mcg by mouth daily.    . hydrochlorothiazide (HYDRODIURIL) 25 MG tablet TAKE 1 TABLET BY MOUTH DAILY (Patient taking differently: Take 25 mg by mouth daily.) 90 tablet 3  . metFORMIN (GLUCOPHAGE) 500 MG tablet TAKE 2 TABLETS(1000 MG) BY MOUTH TWICE DAILY WITH A MEAL (Patient taking differently: Take 1,000 mg by mouth 2 (two) times daily with a meal. TAKE 2 TABLETS(1000 MG) BY MOUTH TWICE DAILY WITH A MEAL) 120 tablet 2  . Multiple Vitamins-Minerals (MULTIVITAMIN MEN 50+) TABS Take 1 tablet by mouth daily.    . Omega-3-6-9 CAPS Take 1 capsule by mouth daily.    . saw palmetto 160 MG capsule Take 160 mg by mouth daily.      No current facility-administered medications on file prior to visit.   Allergies  Allergen Reactions  . Losartan  Swelling  . Penicillins     Childhood - unknown    Social History   Socioeconomic History  . Marital status: Married    Spouse name: Not on file  . Number of children: Not on file  . Years of education: Not on file  . Highest education level: Not on file  Occupational History  . Not on file  Tobacco Use  . Smoking status: Current Some Day Smoker    Years: 5.00    Types: Cigars  . Smokeless tobacco: Former Systems developer    Types: Snuff  Substance and Sexual Activity  . Alcohol use: Yes    Alcohol/week: 0.0 standard drinks    Comment: Occ wine  . Drug use: No  . Sexual activity: Not on file  Other Topics Concern  . Not on file  Social History Narrative  . Not on file    Social Determinants of Health   Financial Resource Strain: Not on file  Food Insecurity: Not on file  Transportation Needs: Not on file  Physical Activity: Not on file  Stress: Not on file  Social Connections: Not on file  Intimate Partner Violence: Not on file   Family History  Problem Relation Age of Onset  . Heart Problems Mother   . Colon cancer Neg Hx       Review of Systems  All other systems reviewed and are negative.      Objective:   Physical Exam Vitals reviewed.  Constitutional:      General: He is not in acute distress.    Appearance: He is well-developed. He is not diaphoretic.  HENT:     Head: Normocephalic and atraumatic.     Right Ear: External ear normal.     Left Ear: External ear normal.     Nose: Nose normal.     Mouth/Throat:     Pharynx: No oropharyngeal exudate.  Eyes:     General: No scleral icterus.       Right eye: No discharge.        Left eye: No discharge.     Conjunctiva/sclera: Conjunctivae normal.     Pupils: Pupils are equal, round, and reactive to light.  Neck:     Thyroid: No thyromegaly.     Vascular: No JVD.     Trachea: No tracheal deviation.  Cardiovascular:     Rate and Rhythm: Normal rate and regular rhythm.     Heart sounds: Normal heart sounds. No murmur heard. No friction rub. No gallop.   Pulmonary:     Effort: Pulmonary effort is normal. No respiratory distress.     Breath sounds: Normal breath sounds. No stridor. No wheezing or rales.  Chest:     Chest wall: No tenderness.  Abdominal:     General: Bowel sounds are normal. There is no distension.     Palpations: Abdomen is soft. There is no mass.     Tenderness: There is no abdominal tenderness. There is no guarding or rebound.  Musculoskeletal:        General: No tenderness or deformity. Normal range of motion.     Cervical back: Normal range of motion and neck supple.  Lymphadenopathy:     Cervical: No cervical adenopathy.  Neurological:     Mental  Status: He is alert and oriented to person, place, and time.     Cranial Nerves: No cranial nerve deficit.     Motor: No abnormal muscle tone.     Coordination: Coordination normal.  Deep Tendon Reflexes: Reflexes are normal and symmetric.  Psychiatric:        Behavior: Behavior normal.        Thought Content: Thought content normal.        Judgment: Judgment normal.          Assessment & Plan:  General medical exam  Diabetes mellitus with complication in adult patient ([place]) - Plan: Hemoglobin A1c, CBC with Differential/Platelet, COMPLETE METABOLIC PANEL WITH GFR, Lipid panel, Microalbumin, urine  Morbid obesity ([facility])  Benign essential HTN  Hypercholesteremia  Prostate cancer screening - Plan: CANCELED: PSA  Immunizations are up-to-date except for the shingles vaccine.  Patient will check on the price of this before receiving it.  He is already scheduled his colonoscopy for later this week.  PSA is up-to-date.  Blood pressure today is slightly elevated however he is checking this consistently at home and finding systolic blood pressures in the 1 20-1 30 range over 70s to 80s.  This is acceptable.  I will check an A1c.  Goal A1c is less than 6.5.  Check a fasting lipid panel.  Goal LDL cholesterol is less than 100.  Check a urine microalbumin.  Goal ratio is less than 30.  I will be glad to schedule the patient for a sleep study at his earliest convenience.  Of asked him to let me know when he wants me to schedule him.  Continue to encourage smoking cessation along with diet exercise and weight loss.

## 2020-03-21 ENCOUNTER — Encounter (HOSPITAL_COMMUNITY): Payer: Self-pay

## 2020-03-21 ENCOUNTER — Other Ambulatory Visit: Payer: Self-pay

## 2020-03-21 ENCOUNTER — Encounter (HOSPITAL_COMMUNITY)
Admission: RE | Admit: 2020-03-21 | Discharge: 2020-03-21 | Disposition: A | Discharge status: Home / Self Care | Payer: BC Managed Care – PPO | Source: Ambulatory Visit | Attending: Internal Medicine | Admitting: Internal Medicine

## 2020-03-21 ENCOUNTER — Other Ambulatory Visit (HOSPITAL_COMMUNITY)
Admission: RE | Admit: 2020-03-21 | Discharge: 2020-03-21 | Disposition: A | Discharge status: Home / Self Care | Payer: BC Managed Care – PPO | Source: Ambulatory Visit | Attending: Internal Medicine | Admitting: Internal Medicine

## 2020-03-21 DIAGNOSIS — Z01818 Encounter for other preprocedural examination: Secondary | ICD-10-CM | POA: Insufficient documentation

## 2020-03-21 DIAGNOSIS — Z7982 Long term (current) use of aspirin: Secondary | ICD-10-CM | POA: Diagnosis not present

## 2020-03-21 DIAGNOSIS — Z20822 Contact with and (suspected) exposure to covid-19: Secondary | ICD-10-CM | POA: Insufficient documentation

## 2020-03-21 DIAGNOSIS — D123 Benign neoplasm of transverse colon: Secondary | ICD-10-CM | POA: Diagnosis not present

## 2020-03-21 DIAGNOSIS — Z880 Allergy status to penicillin: Secondary | ICD-10-CM | POA: Diagnosis not present

## 2020-03-21 DIAGNOSIS — Z79899 Other long term (current) drug therapy: Secondary | ICD-10-CM | POA: Diagnosis not present

## 2020-03-21 DIAGNOSIS — I10 Essential (primary) hypertension: Secondary | ICD-10-CM | POA: Insufficient documentation

## 2020-03-21 DIAGNOSIS — Z7984 Long term (current) use of oral hypoglycemic drugs: Secondary | ICD-10-CM | POA: Diagnosis not present

## 2020-03-21 DIAGNOSIS — D122 Benign neoplasm of ascending colon: Secondary | ICD-10-CM | POA: Diagnosis not present

## 2020-03-21 DIAGNOSIS — F17290 Nicotine dependence, other tobacco product, uncomplicated: Secondary | ICD-10-CM | POA: Diagnosis not present

## 2020-03-21 DIAGNOSIS — Z86010 Personal history of colonic polyps: Secondary | ICD-10-CM | POA: Diagnosis not present

## 2020-03-21 DIAGNOSIS — Z1211 Encounter for screening for malignant neoplasm of colon: Secondary | ICD-10-CM | POA: Diagnosis not present

## 2020-03-21 DIAGNOSIS — Z888 Allergy status to other drugs, medicaments and biological substances status: Secondary | ICD-10-CM | POA: Diagnosis not present

## 2020-03-21 LAB — COMPLETE METABOLIC PANEL WITH GFR
AG Ratio: 1.6 (calc) (ref 1.0–2.5)
ALT: 27 U/L (ref 9–46)
AST: 21 U/L (ref 10–35)
Albumin: 4.1 g/dL (ref 3.6–5.1)
Alkaline phosphatase (APISO): 61 U/L (ref 35–144)
BUN: 14 mg/dL (ref 7–25)
CO2: 26 mmol/L (ref 20–32)
Calcium: 9.4 mg/dL (ref 8.6–10.3)
Chloride: 106 mmol/L (ref 98–110)
Creat: 0.97 mg/dL (ref 0.70–1.25)
GFR, Est African American: 97 mL/min/{1.73_m2} (ref >=60)
GFR, Est Non African American: 84 mL/min/{1.73_m2} (ref >=60)
Globulin: 2.6 g/dL (calc) (ref 1.9–3.7)
Glucose, Bld: 145 mg/dL — ABNORMAL HIGH (ref 65–99)
Potassium: 4.1 mmol/L (ref 3.5–5.3)
Sodium: 142 mmol/L (ref 135–146)
Total Bilirubin: 0.5 mg/dL (ref 0.2–1.2)
Total Protein: 6.7 g/dL (ref 6.1–8.1)

## 2020-03-21 LAB — CBC WITH DIFFERENTIAL/PLATELET
Absolute Monocytes: 543 cells/uL (ref 200–950)
Basophils Absolute: 39 cells/uL (ref 0–200)
Basophils Relative: 0.7 %
Eosinophils Absolute: 112 cells/uL (ref 15–500)
Eosinophils Relative: 2 %
HCT: 46.1 % (ref 38.5–50.0)
Hemoglobin: 16.1 g/dL (ref 13.2–17.1)
Lymphs Abs: 1254 cells/uL (ref 850–3900)
MCH: 33.3 pg — ABNORMAL HIGH (ref 27.0–33.0)
MCHC: 34.9 g/dL (ref 32.0–36.0)
MCV: 95.4 fL (ref 80.0–100.0)
MPV: 10.9 fL (ref 7.5–12.5)
Monocytes Relative: 9.7 %
Neutro Abs: 3651 cells/uL (ref 1500–7800)
Neutrophils Relative %: 65.2 %
Platelets: 221 10*3/uL (ref 140–400)
RBC: 4.83 10*6/uL (ref 4.20–5.80)
RDW: 12.8 % (ref 11.0–15.0)
Total Lymphocyte: 22.4 %
WBC: 5.6 10*3/uL (ref 3.8–10.8)

## 2020-03-21 LAB — LIPID PANEL
Cholesterol: 106 mg/dL (ref <200)
HDL: 39 mg/dL — ABNORMAL LOW (ref >=40)
LDL Cholesterol (Calc): 48 mg/dL (calc)
Non-HDL Cholesterol (Calc): 67 mg/dL (calc) (ref <130)
Total CHOL/HDL Ratio: 2.7 (calc) (ref <5.0)
Triglycerides: 106 mg/dL (ref <150)

## 2020-03-21 LAB — HEMOGLOBIN A1C
Hgb A1c MFr Bld: 6.7 % of total Hgb — ABNORMAL HIGH (ref <5.7)
Mean Plasma Glucose: 146 mg/dL
eAG (mmol/L): 8.1 mmol/L

## 2020-03-21 LAB — MICROALBUMIN, URINE: Microalb, Ur: 1.2 mg/dL

## 2020-03-22 LAB — SARS CORONAVIRUS 2 (TAT 6-24 HRS): SARS Coronavirus 2: NEGATIVE

## 2020-03-23 ENCOUNTER — Encounter (HOSPITAL_COMMUNITY): Payer: Self-pay | Admitting: Internal Medicine

## 2020-03-23 ENCOUNTER — Ambulatory Visit (HOSPITAL_COMMUNITY)
Admission: RE | Admit: 2020-03-23 | Discharge: 2020-03-23 | Disposition: A | Discharge status: Home / Self Care | Payer: BC Managed Care – PPO | Attending: Internal Medicine | Admitting: Internal Medicine

## 2020-03-23 ENCOUNTER — Ambulatory Visit (HOSPITAL_COMMUNITY): Payer: BC Managed Care – PPO | Admitting: Anesthesiology

## 2020-03-23 ENCOUNTER — Encounter (HOSPITAL_COMMUNITY)
Admission: RE | Disposition: A | Discharge status: Home / Self Care | Payer: Self-pay | Source: Home / Self Care | Attending: Internal Medicine

## 2020-03-23 ENCOUNTER — Other Ambulatory Visit: Payer: Self-pay

## 2020-03-23 DIAGNOSIS — D123 Benign neoplasm of transverse colon: Secondary | ICD-10-CM | POA: Diagnosis not present

## 2020-03-23 DIAGNOSIS — Z880 Allergy status to penicillin: Secondary | ICD-10-CM | POA: Insufficient documentation

## 2020-03-23 DIAGNOSIS — Z20822 Contact with and (suspected) exposure to covid-19: Secondary | ICD-10-CM | POA: Insufficient documentation

## 2020-03-23 DIAGNOSIS — Z888 Allergy status to other drugs, medicaments and biological substances status: Secondary | ICD-10-CM | POA: Insufficient documentation

## 2020-03-23 DIAGNOSIS — Z7984 Long term (current) use of oral hypoglycemic drugs: Secondary | ICD-10-CM | POA: Insufficient documentation

## 2020-03-23 DIAGNOSIS — Z79899 Other long term (current) drug therapy: Secondary | ICD-10-CM | POA: Diagnosis not present

## 2020-03-23 DIAGNOSIS — D122 Benign neoplasm of ascending colon: Secondary | ICD-10-CM | POA: Insufficient documentation

## 2020-03-23 DIAGNOSIS — F17290 Nicotine dependence, other tobacco product, uncomplicated: Secondary | ICD-10-CM | POA: Diagnosis not present

## 2020-03-23 DIAGNOSIS — Z1211 Encounter for screening for malignant neoplasm of colon: Secondary | ICD-10-CM | POA: Insufficient documentation

## 2020-03-23 DIAGNOSIS — I129 Hypertensive chronic kidney disease with stage 1 through stage 4 chronic kidney disease, or unspecified chronic kidney disease: Secondary | ICD-10-CM | POA: Diagnosis not present

## 2020-03-23 DIAGNOSIS — Z7982 Long term (current) use of aspirin: Secondary | ICD-10-CM | POA: Diagnosis not present

## 2020-03-23 DIAGNOSIS — K635 Polyp of colon: Secondary | ICD-10-CM | POA: Diagnosis not present

## 2020-03-23 DIAGNOSIS — Z86010 Personal history of colonic polyps: Secondary | ICD-10-CM | POA: Diagnosis not present

## 2020-03-23 DIAGNOSIS — Z09 Encounter for follow-up examination after completed treatment for conditions other than malignant neoplasm: Secondary | ICD-10-CM | POA: Diagnosis not present

## 2020-03-23 HISTORY — PX: COLONOSCOPY WITH PROPOFOL: SHX5780

## 2020-03-23 HISTORY — PX: POLYPECTOMY: SHX5525

## 2020-03-23 LAB — GLUCOSE, CAPILLARY
Glucose-Capillary: 135 mg/dL — ABNORMAL HIGH (ref 70–99)
Glucose-Capillary: 122 mg/dL — ABNORMAL HIGH (ref 70–99)

## 2020-03-23 SURGERY — COLONOSCOPY WITH PROPOFOL
Anesthesia: General

## 2020-03-23 MED ORDER — GLYCOPYRROLATE 0.2 MG/ML IJ SOLN
INTRAMUSCULAR | Status: DC | PRN
  Administered 2020-03-23: .2 mg via INTRAVENOUS

## 2020-03-23 MED ORDER — KETAMINE HCL 10 MG/ML IJ SOLN
INTRAMUSCULAR | Status: DC | PRN
  Administered 2020-03-23: 15 mg via INTRAVENOUS

## 2020-03-23 MED ORDER — PROPOFOL 10 MG/ML IV BOLUS
INTRAVENOUS | Status: DC | PRN
  Administered 2020-03-23: 100 mg via INTRAVENOUS

## 2020-03-23 MED ORDER — LACTATED RINGERS IV SOLN: INTRAVENOUS | Status: DC | PRN

## 2020-03-23 MED ORDER — LIDOCAINE HCL (CARDIAC) PF 100 MG/5ML IV SOSY
PREFILLED_SYRINGE | INTRAVENOUS | Status: DC | PRN
  Administered 2020-03-23: 100 mg via INTRAVENOUS

## 2020-03-23 MED ORDER — LACTATED RINGERS IV SOLN: Freq: Once | INTRAVENOUS | Status: AC

## 2020-03-23 MED ORDER — GLYCOPYRROLATE PF 0.2 MG/ML IJ SOSY
PREFILLED_SYRINGE | INTRAMUSCULAR | Status: AC
  Filled 2020-03-23: qty 1

## 2020-03-23 MED ORDER — PROPOFOL 10 MG/ML IV BOLUS
INTRAVENOUS | Status: AC
  Filled 2020-03-23: qty 120

## 2020-03-23 MED ORDER — STERILE WATER FOR IRRIGATION IR SOLN
Status: DC | PRN
  Administered 2020-03-23: 09:00:00 100 mL

## 2020-03-23 MED ORDER — KETAMINE HCL 50 MG/5ML IJ SOSY
PREFILLED_SYRINGE | INTRAMUSCULAR | Status: AC
  Filled 2020-03-23: qty 5

## 2020-03-23 MED ORDER — PROPOFOL 500 MG/50ML IV EMUL
INTRAVENOUS | Status: DC | PRN
  Administered 2020-03-23: 150 ug/kg/min via INTRAVENOUS

## 2020-03-23 NOTE — H&P (Signed)
@LOGO @   Primary Care Physician:  [name], MD Primary Gastroenterologist:  Dr. Gala Romney  Pre-Procedure History & Physical: HPI:  Eric Johnson is a 59 y.o. male here for surveillance colonoscopy.  History of multiple colonic adenomas removed about 5 years ago.  Past Medical History:  Diagnosis Date  . Bronchitis   . Diabetes mellitus   . Hypercholesteremia   . Hypertension   . Kidney stones   . Morbid obesity ([place]) 10/30/2012    Past Surgical History:  Procedure Laterality Date  . COLONOSCOPY N/A 08/27/2013   Dr. Gala Romney: Multiple tubular adenomas removed.  Next colonoscopy 5 years.  . COLONOSCOPY W/ POLYPECTOMY  2000  . kidney stone removal      Prior to Admission medications   Medication Sig Start Date End Date Taking? Authorizing Provider  aspirin 81 MG chewable tablet Chew 81 mg by mouth daily.   Yes [provider]  atorvastatin (LIPITOR) 40 MG tablet Take 1 tablet (40 mg total) by mouth daily. 10/13/19  Yes [name], MD  beta carotene w/minerals (OCUVITE) tablet Take 1 tablet by mouth daily.   Yes [provider]  Cyanocobalamin (VITAMIN B 12) 500 MCG TABS Take 500 mcg by mouth daily.   Yes [provider]  hydrochlorothiazide (HYDRODIURIL) 25 MG tablet TAKE 1 TABLET BY MOUTH DAILY Patient taking differently: Take 25 mg by mouth daily. 01/06/20  Yes [name], MD  metFORMIN (GLUCOPHAGE) 500 MG tablet TAKE 2 TABLETS(1000 MG) BY MOUTH TWICE DAILY WITH A MEAL Patient taking differently: Take 1,000 mg by mouth 2 (two) times daily with a meal. TAKE 2 TABLETS(1000 MG) BY MOUTH TWICE DAILY WITH A MEAL 01/06/20  Yes [name], MD  Multiple Vitamins-Minerals (MULTIVITAMIN MEN 50+) TABS Take 1 tablet by mouth daily.   Yes [provider]  Omega-3-6-9 CAPS Take 1 capsule by mouth daily.   Yes [provider]  saw palmetto 160 MG capsule Take 160 mg by mouth daily.    Yes [provider]    Allergies as  of 02/18/2020 - Review Complete 02/18/2020  Allergen Reaction Noted  . Losartan Swelling 12/11/2015  . Penicillins  06/28/2011    Family History  Problem Relation Age of Onset  . Heart Problems Mother   . Colon cancer Neg Hx     Social History   Socioeconomic History  . Marital status: Married    Spouse name: Not on file  . Number of children: Not on file  . Years of education: Not on file  . Highest education level: Not on file  Occupational History  . Not on file  Tobacco Use  . Smoking status: Current Some Day Smoker    Years: 5.00    Types: Cigars  . Smokeless tobacco: Former Systems developer    Types: Snuff  Substance and Sexual Activity  . Alcohol use: Yes    Alcohol/week: 0.0 standard drinks    Comment: Occ wine  . Drug use: No  . Sexual activity: Not on file  Other Topics Concern  . Not on file  Social History Narrative  . Not on file   Social Determinants of Health   Financial Resource Strain: Not on file  Food Insecurity: Not on file  Transportation Needs: Not on file  Physical Activity: Not on file  Stress: Not on file  Social Connections: Not on file  Intimate Partner Violence: Not on file    Review of Systems: See HPI, otherwise negative ROS  Physical Exam: BP 120/73   Pulse 89   Temp 98 F (36.7 C) (Oral)   Resp 18   Ht 5\' 11"  (1.803 m)   Wt (!) 138.3 kg   SpO2 95%   BMI 42.52 kg/m  General:   Alert,  Well-developed, well-nourished, pleasant and cooperative in NAD Neck:  Supple; no masses or thyromegaly. No significant cervical adenopathy. Lungs:  Clear throughout to auscultation.   No wheezes, crackles, or rhonchi. No acute distress. Heart:  Regular rate and rhythm; no murmurs, clicks, rubs,  or gallops. Abdomen: Non-distended, normal bowel sounds.  Soft and nontender without appreciable mass or hepatosplenomegaly.  Pulses:  Normal pulses noted. Extremities:  Without clubbing or edema.  Impression/Plan: 59 year old gentleman here for  surveillance colonoscopy.  History of multiple colonic adenomas removed previously. The risks, benefits, limitations, alternatives and imponderables have been reviewed with the patient. Questions have been answered. All parties are agreeable.      Notice: This dictation was prepared with Dragon dictation along with smaller phrase technology. Any transcriptional errors that result from this process are unintentional and may not be corrected upon review.

## 2020-03-23 NOTE — Anesthesia Preprocedure Evaluation (Signed)
Anesthesia Evaluation  Patient identified by MRN, date of birth, ID band Patient awake    Reviewed: Allergy & Precautions, NPO status , Patient's Chart, lab work & pertinent test results  History of Anesthesia Complications Negative for: history of anesthetic complications  Airway Mallampati: III  TM Distance: >3 FB Neck ROM: Full    Dental  (+) Dental Advisory Given, Missing, Chipped, Poor Dentition   Pulmonary Current Smoker and Patient abstained from smoking.,    Pulmonary exam normal breath sounds clear to auscultation       Cardiovascular Exercise Tolerance: Good hypertension, Pt. on medications Normal cardiovascular exam Rhythm:Regular Rate:Normal     Neuro/Psych negative neurological ROS  negative psych ROS   GI/Hepatic negative GI ROS, Neg liver ROS,   Endo/Other  diabetes, Well Controlled, Type 2, Oral Hypoglycemic AgentsMorbid obesity  Renal/GU Renal InsufficiencyRenal disease     Musculoskeletal negative musculoskeletal ROS (+)   Abdominal   Peds  Hematology negative hematology ROS (+)   Anesthesia Other Findings   Reproductive/Obstetrics                            Anesthesia Physical Anesthesia Plan  ASA: III  Anesthesia Plan: General   Post-op Pain Management:    Induction: Intravenous  PONV Risk Score and Plan: TIVA  Airway Management Planned: Nasal Cannula and Natural Airway  Additional Equipment:   Intra-op Plan:   Post-operative Plan:   Informed Consent: I have reviewed the patients History and Physical, chart, labs and discussed the procedure including the risks, benefits and alternatives for the proposed anesthesia with the patient or authorized representative who has indicated his/her understanding and acceptance.     Dental advisory given  Plan Discussed with: CRNA and Surgeon  Anesthesia Plan Comments:         Anesthesia Quick  Evaluation

## 2020-03-23 NOTE — Anesthesia Postprocedure Evaluation (Signed)
Anesthesia Post Note  Patient: Eric Johnson  Procedure(s) Performed: COLONOSCOPY WITH PROPOFOL (N/A ) POLYPECTOMY  Patient location during evaluation: PACU Anesthesia Type: General Level of consciousness: awake and alert Pain management: pain level controlled Vital Signs Assessment: post-procedure vital signs reviewed and stable Respiratory status: spontaneous breathing Cardiovascular status: stable Postop Assessment: no apparent nausea or vomiting Anesthetic complications: no   No complications documented.   Last Vitals:  Vitals:   03/23/20 0753 03/23/20 0915  BP: 120/73 102/65  Pulse: 89 92  Resp: 18 19  Temp: 36.7 C 36.5 C  SpO2: 95% 95%    Last Pain:  Vitals:   03/23/20 0915  TempSrc:   PainSc: 0-No pain                 Everette Rank

## 2020-03-23 NOTE — Discharge Instructions (Signed)
Colonoscopy Discharge Instructions  Read the instructions outlined below and refer to this sheet in the next few weeks. These discharge instructions provide you with general information on caring for yourself after you leave the hospital. Your doctor may also give you specific instructions. While your treatment has been planned according to the most current medical practices available, unavoidable complications occasionally occur. If you have any problems or questions after discharge, call Dr. Gala Romney at [phone]. ACTIVITY  You may resume your regular activity, but move at a slower pace for the next 24 hours.   Take frequent rest periods for the next 24 hours.   Walking will help get rid of the air and reduce the bloated feeling in your belly (abdomen).   No driving for 24 hours (because of the medicine (anesthesia) used during the test).    Do not sign any important legal documents or operate any machinery for 24 hours (because of the anesthesia used during the test).  NUTRITION  Drink plenty of fluids.   You may resume your normal diet as instructed by your doctor.   Begin with a light meal and progress to your normal diet. Heavy or fried foods are harder to digest and may make you feel sick to your stomach (nauseated).   Avoid alcoholic beverages for 24 hours or as instructed.  MEDICATIONS  You may resume your normal medications unless your doctor tells you otherwise.  WHAT YOU CAN EXPECT TODAY  Some feelings of bloating in the abdomen.   Passage of more gas than usual.   Spotting of blood in your stool or on the toilet paper.  IF YOU HAD POLYPS REMOVED DURING THE COLONOSCOPY:  No aspirin products for 7 days or as instructed.   No alcohol for 7 days or as instructed.   Eat a soft diet for the next 24 hours.  FINDING OUT THE RESULTS OF YOUR TEST Not all test results are available during your visit. If your test results are not back during the visit, make an appointment  with your caregiver to find out the results. Do not assume everything is normal if you have not heard from your caregiver or the medical facility. It is important for you to follow up on all of your test results.  SEEK IMMEDIATE MEDICAL ATTENTION IF:  You have more than a spotting of blood in your stool.   Your belly is swollen (abdominal distention).   You are nauseated or vomiting.   You have a temperature over 101.   You have abdominal pain or discomfort that is severe or gets worse throughout the day.   2 polyps removed from your colon today  Further recommendations to follow pending review of pathology report  At patient request, I called Collie Siad at [phone] results    Colon Polyps  Polyps are tissue growths inside the body. Polyps can grow in many places, including the large intestine (colon). A polyp may be a round bump or a mushroom-shaped growth. You could have one polyp or several. Most colon polyps are noncancerous (benign). However, some colon polyps can become cancerous over time. Finding and removing the polyps early can help prevent this. What are the causes? The exact cause of colon polyps is not known. What increases the risk? You are more likely to develop this condition if you:  Have a family history of colon cancer or colon polyps.  Are older than 14 or older than 45 if you are African American.  Have inflammatory bowel disease,  such as ulcerative colitis or Crohn's disease.  Have certain hereditary conditions, such as: ? Familial adenomatous polyposis. ? Lynch syndrome. ? Turcot syndrome. ? Peutz-Jeghers syndrome.  Are overweight.  Smoke cigarettes.  Do not get enough exercise.  Drink too much alcohol.  Eat a diet that is high in fat and red meat and low in fiber.  Had childhood cancer that was treated with abdominal radiation. What are the signs or symptoms? Most polyps do not cause symptoms. If you have symptoms, they may  include:  Blood coming from your rectum when having a bowel movement.  Blood in your stool. The stool may look dark red or black.  Abdominal pain.  A change in bowel habits, such as constipation or diarrhea. How is this diagnosed? This condition is diagnosed with a colonoscopy. This is a procedure in which a lighted, flexible scope is inserted into the anus and then passed into the colon to examine the area. Polyps are sometimes found when a colonoscopy is done as part of routine cancer screening tests. How is this treated? Treatment for this condition involves removing any polyps that are found. Most polyps can be removed during a colonoscopy. Those polyps will then be tested for cancer. Additional treatment may be needed depending on the results of testing. Follow these instructions at home: Lifestyle  Maintain a healthy weight, or lose weight if recommended by your health care provider.  Exercise every day or as told by your health care provider.  Do not use any products that contain nicotine or tobacco, such as cigarettes and e-cigarettes. If you need help quitting, ask your health care provider.  If you drink alcohol, limit how much you have: ? 0-1 drink a day for women. ? 0-2 drinks a day for men.  Be aware of how much alcohol is in your drink. In the [place] one drink equals one 12 oz bottle of beer (355 mL), one 5 oz glass of wine (148 mL), or one 1 oz shot of hard liquor (44 mL). Eating and drinking   Eat foods that are high in fiber, such as fruits, vegetables, and whole grains.  Eat foods that are high in calcium and vitamin D, such as milk, cheese, yogurt, eggs, liver, fish, and broccoli.  Limit foods that are high in fat, such as fried foods and desserts.  Limit the amount of red meat and processed meat you eat, such as hot dogs, sausage, bacon, and lunch meats. General instructions  Keep all follow-up visits as told by your health care provider. This is  important. ? This includes having regularly scheduled colonoscopies. ? Talk to your health care provider about when you need a colonoscopy. Contact a health care provider if:  You have new or worsening bleeding during a bowel movement.  You have new or increased blood in your stool.  You have a change in bowel habits.  You lose weight for no known reason. Summary  Polyps are tissue growths inside the body. Polyps can grow in many places, including the colon.  Most colon polyps are noncancerous (benign), but some can become cancerous over time.  This condition is diagnosed with a colonoscopy.  Treatment for this condition involves removing any polyps that are found. Most polyps can be removed during a colonoscopy. This information is not intended to replace advice given to you by your health care provider. Make sure you discuss any questions you have with your health care provider. Document Revised: 07/03/2017 Document Reviewed: 07/03/2017 Elsevier  Patient Education  2020 [place] After These instructions provide you with information about caring for yourself after your procedure. Your health care provider may also give you more specific instructions. Your treatment has been planned according to current medical practices, but problems sometimes occur. Call your health care provider if you have any problems or questions after your procedure. What can I expect after the procedure? After your procedure, you may:  Feel sleepy for several hours.  Feel clumsy and have poor balance for several hours.  Feel forgetful about what happened after the procedure.  Have poor judgment for several hours.  Feel nauseous or vomit.  Have a sore throat if you had a breathing tube during the procedure. Follow these instructions at home: For at least 24 hours after the procedure:      Have a responsible adult stay with you. It is important to have  someone help care for you until you are awake and alert.  Rest as needed.  Do not: ? Participate in activities in which you could fall or become injured. ? Drive. ? Use heavy machinery. ? Drink alcohol. ? Take sleeping pills or medicines that cause drowsiness. ? Make important decisions or sign legal documents. ? Take care of children on your own. Eating and drinking  Follow the diet that is recommended by your health care provider.  If you vomit, drink water, juice, or soup when you can drink without vomiting.  Make sure you have little or no nausea before eating solid foods. General instructions  Take over-the-counter and prescription medicines only as told by your health care provider.  If you have sleep apnea, surgery and certain medicines can increase your risk for breathing problems. Follow instructions from your health care provider about wearing your sleep device: ? Anytime you are sleeping, including during daytime naps. ? While taking prescription pain medicines, sleeping medicines, or medicines that make you drowsy.  If you smoke, do not smoke without supervision.  Keep all follow-up visits as told by your health care provider. This is important. Contact a health care provider if:  You keep feeling nauseous or you keep vomiting.  You feel light-headed.  You develop a rash.  You have a fever. Get help right away if:  You have trouble breathing. Summary  For several hours after your procedure, you may feel sleepy and have poor judgment.  Have a responsible adult stay with you for at least 24 hours or until you are awake and alert. This information is not intended to replace advice given to you by your health care provider. Make sure you discuss any questions you have with your health care provider. Document Revised: 06/16/2017 Document Reviewed: 07/09/2015 Elsevier Patient Education  [place].

## 2020-03-23 NOTE — Op Note (Signed)
[facility] Patient Name: Eric Johnson Procedure Date: 03/23/2020 8:42 AM MRN: [id number] Date of Birth: [birth year] Attending MD: Norvel Richards , MD CSN: [id number] Age: 59 Admit Type: Outpatient Procedure:                Colonoscopy Indications:              High risk colon cancer surveillance: Personal                            history of colonic polyps Providers:                Norvel Richards, MD, Lambert Mody,                            Raphael Gibney, Technician Referring MD:              Medicines:                Propofol per Anesthesia Complications:            No immediate complications. Estimated Blood Loss:     Estimated blood loss was minimal. Procedure:                Pre-Anesthesia Assessment:                           - Prior to the procedure, a History and Physical                            was performed, and patient medications and                            allergies were reviewed. The patient's tolerance of                            previous anesthesia was also reviewed. The risks                            and benefits of the procedure and the sedation                            options and risks were discussed with the patient.                            All questions were answered, and informed consent                            was obtained. Prior Anticoagulants: The patient has                            taken no previous anticoagulant or antiplatelet                            agents. ASA Grade Assessment: III - A patient with  severe systemic disease. After reviewing the risks                            and benefits, the patient was deemed in                            satisfactory condition to undergo the procedure.                           After obtaining informed consent, the colonoscope                            was passed under direct vision. Throughout the                            procedure, the  patient's blood pressure, pulse, and                            oxygen saturations were monitored continuously. The                            [id number] scope was introduced through                            the anus and advanced to the the cecum, identified                            by appendiceal orifice and ileocecal valve. The                            colonoscopy was performed without difficulty. The                            patient tolerated the procedure well. The quality                            of the bowel preparation was adequate. Scope In: 8:55:53 AM Scope Out: 9:11:04 AM Scope Withdrawal Time: 0 hours 9 minutes 41 seconds  Total Procedure Duration: 0 hours 15 minutes 11 seconds  Findings:      The perianal and digital rectal examinations were normal.      Two pedunculated polyps were found in the hepatic flexure and ascending       colon. The polyps were 7 to 8 mm in size. These polyps were removed with       a cold snare. Resection and retrieval were complete. Estimated blood       loss was minimal.      The exam was otherwise without abnormality on direct and retroflexion       views. Impression:               - Two 7 to 8 mm polyps at the hepatic flexure and                            in the ascending colon, removed with a cold snare.  Resected and retrieved.                           - The examination was otherwise normal on direct                            and retroflexion views. Moderate Sedation:      Moderate (conscious) sedation was personally administered by an       anesthesia professional. The following parameters were monitored: oxygen       saturation, heart rate, blood pressure, respiratory rate, EKG, adequacy       of pulmonary ventilation, and response to care. Recommendation:           - Patient has a contact number available for                            emergencies. The signs and symptoms of potential                             delayed complications were discussed with the                            patient. Return to normal activities tomorrow.                            Written discharge instructions were provided to the                            patient.                           - Resume previous diet.                           - Continue present medications.                           - Repeat colonoscopy date to be determined after                            pending pathology results are reviewed for                            surveillance based on pathology results.                           - Return to GI office (date not yet determined). Procedure Code(s):        --- Professional ---                           [phone], Colonoscopy, flexible; with removal of                            tumor(s), polyp(s), or other lesion(s) by snare  technique Diagnosis Code(s):        --- Professional ---                           Z86.010, Personal history of colonic polyps                           K63.5, Polyp of colon CPT copyright 2019 [employer]. All rights reserved. The codes documented in this report are preliminary and upon coder review may  be revised to meet current compliance requirements. Cristopher Estimable. Rut Betterton, MD Norvel Richards, MD 03/23/2020 9:16:46 AM This report has been signed electronically. Number of Addenda: 0

## 2020-03-23 NOTE — Transfer of Care (Signed)
Immediate Anesthesia Transfer of Care Note  Patient: Eric Johnson  Procedure(s) Performed: COLONOSCOPY WITH PROPOFOL (N/A ) POLYPECTOMY  Patient Location: PACU  Anesthesia Type:MAC and General  Level of Consciousness: awake, alert , oriented and patient cooperative  Airway & Oxygen Therapy: Patient Spontanous Breathing  Post-op Assessment: Report given to RN and Post -op Vital signs reviewed and stable  Post vital signs: Reviewed and stable  Last Vitals:  Vitals Value Taken Time  BP 102/65 03/23/20 0916  Temp    Pulse 92 03/23/20 0917  Resp 20 03/23/20 0917  SpO2 95 % 03/23/20 0917  Vitals shown include unvalidated device data.  Last Pain:  Vitals:   03/23/20 0851  TempSrc:   PainSc: 0-No pain         Complications: No complications documented.

## 2020-03-27 LAB — SURGICAL PATHOLOGY

## 2020-03-28 ENCOUNTER — Encounter: Payer: Self-pay | Admitting: Internal Medicine

## 2020-03-29 DIAGNOSIS — D0359 Melanoma in situ of other part of trunk: Secondary | ICD-10-CM | POA: Diagnosis not present

## 2020-03-30 ENCOUNTER — Encounter (HOSPITAL_COMMUNITY): Payer: Self-pay | Admitting: Internal Medicine

## 2020-04-09 DIAGNOSIS — Z20828 Contact with and (suspected) exposure to other viral communicable diseases: Secondary | ICD-10-CM | POA: Diagnosis not present

## 2020-04-14 ENCOUNTER — Encounter: Payer: Self-pay | Admitting: Family Medicine

## 2020-04-25 DIAGNOSIS — D0359 Melanoma in situ of other part of trunk: Secondary | ICD-10-CM | POA: Diagnosis not present

## 2020-04-25 DIAGNOSIS — Z1283 Encounter for screening for malignant neoplasm of skin: Secondary | ICD-10-CM | POA: Diagnosis not present

## 2020-04-25 DIAGNOSIS — D225 Melanocytic nevi of trunk: Secondary | ICD-10-CM | POA: Diagnosis not present

## 2020-04-25 DIAGNOSIS — L988 Other specified disorders of the skin and subcutaneous tissue: Secondary | ICD-10-CM | POA: Diagnosis not present

## 2020-04-25 DIAGNOSIS — D485 Neoplasm of uncertain behavior of skin: Secondary | ICD-10-CM | POA: Diagnosis not present

## 2020-04-29 ENCOUNTER — Other Ambulatory Visit: Payer: Self-pay | Admitting: Family Medicine

## 2020-05-26 ENCOUNTER — Other Ambulatory Visit (HOSPITAL_COMMUNITY): Payer: BC Managed Care – PPO

## 2020-06-29 ENCOUNTER — Other Ambulatory Visit: Payer: Self-pay | Admitting: Family Medicine

## 2020-07-27 ENCOUNTER — Other Ambulatory Visit: Payer: Self-pay | Admitting: Family Medicine

## 2020-09-17 ENCOUNTER — Other Ambulatory Visit: Payer: Self-pay | Admitting: Family Medicine

## 2020-12-21 ENCOUNTER — Other Ambulatory Visit: Payer: Self-pay | Admitting: Family Medicine

## 2020-12-21 DIAGNOSIS — I10 Essential (primary) hypertension: Secondary | ICD-10-CM

## 2020-12-26 ENCOUNTER — Other Ambulatory Visit: Payer: Self-pay | Admitting: Family Medicine

## 2020-12-26 DIAGNOSIS — I10 Essential (primary) hypertension: Secondary | ICD-10-CM

## 2021-03-02 ENCOUNTER — Other Ambulatory Visit: Payer: Self-pay | Admitting: Family Medicine

## 2021-03-05 ENCOUNTER — Other Ambulatory Visit: Payer: Self-pay

## 2021-03-05 ENCOUNTER — Encounter: Payer: Self-pay | Admitting: Nurse Practitioner

## 2021-03-05 ENCOUNTER — Telehealth (INDEPENDENT_AMBULATORY_CARE_PROVIDER_SITE_OTHER): Payer: BLUE CROSS/BLUE SHIELD | Admitting: Nurse Practitioner

## 2021-03-05 DIAGNOSIS — J069 Acute upper respiratory infection, unspecified: Secondary | ICD-10-CM

## 2021-03-05 DIAGNOSIS — F17290 Nicotine dependence, other tobacco product, uncomplicated: Secondary | ICD-10-CM | POA: Diagnosis not present

## 2021-03-05 DIAGNOSIS — Z20822 Contact with and (suspected) exposure to covid-19: Secondary | ICD-10-CM | POA: Diagnosis not present

## 2021-03-05 MED ORDER — PREDNISONE 20 MG PO TABS: 40.0000 mg | ORAL_TABLET | Freq: Every day | ORAL | 0 refills | Status: DC

## 2021-03-05 MED ORDER — OSELTAMIVIR PHOSPHATE 75 MG PO CAPS: 75.0000 mg | ORAL_CAPSULE | Freq: Two times a day (BID) | ORAL | 0 refills | Status: DC

## 2021-03-05 NOTE — Progress Notes (Signed)
Subjective:    Patient ID: Eric Johnson, male    DOB: 1960-09-09, 60 y.o.   MRN: [id number]  HPI: GRAEME MENEES is a 60 y.o. male presenting virtually for cough and congestion.  Chief Complaint  Patient presents with   Cough   UPPER RESPIRATORY TRACT INFECTION Onset: 10 days ago, symptoms improved slightly then returned Fever: no Body aches: yes Chills: no Cough: yes; productive Shortness of breath: no Wheezing: no Chest pain: no Chest tightness: no Chest congestion: yes Nasal congestion: yes; worse in the am Runny nose: yes Post nasal drip: no Sneezing: no Sore throat: yes Swollen glands: no Sinus pressure: no Headache: no Face pain: no Toothache: no Ear pain: no  Ear pressure: no  Eyes red/itching:no Eye drainage/crusting:  yes; watery   Nausea: no  Vomiting: no Diarrhea: no  Change in appetite: no  Loss of taste/smell: no  Rash: no Fatigue: yes Sick contacts:  yes; wife Strep contacts: no  Context: stable Recurrent sinusitis: no Treatments attempted: Dayquil Relief with OTC medications: yes  Allergies  Allergen Reactions   Losartan Swelling   Penicillins     Childhood - unknown     Outpatient Encounter Medications as of 03/05/2021  Medication Sig   oseltamivir (TAMIFLU) 75 MG capsule Take 1 capsule (75 mg total) by mouth 2 (two) times daily.   predniSONE (DELTASONE) 20 MG tablet Take 2 tablets (40 mg total) by mouth daily with breakfast.   aspirin 81 MG chewable tablet Chew 81 mg by mouth daily.   atorvastatin (LIPITOR) 40 MG tablet TAKE 1 TABLET(40 MG) BY MOUTH DAILY   beta carotene w/minerals (OCUVITE) tablet Take 1 tablet by mouth daily.   Cyanocobalamin (VITAMIN B 12) 500 MCG TABS Take 500 mcg by mouth daily.   hydrochlorothiazide (HYDRODIURIL) 25 MG tablet TAKE 1 TABLET BY MOUTH DAILY   metFORMIN (GLUCOPHAGE) 500 MG tablet TAKE 2 TABLETS(1000 MG) BY MOUTH TWICE DAILY WITH A MEAL   metFORMIN (GLUCOPHAGE) 500 MG tablet TAKE 2 TABLETS(1000  MG) BY MOUTH TWICE DAILY WITH A MEAL   Multiple Vitamins-Minerals (MULTIVITAMIN MEN 50+) TABS Take 1 tablet by mouth daily.   Omega-3-6-9 CAPS Take 1 capsule by mouth daily.   saw palmetto 160 MG capsule Take 160 mg by mouth daily.    No facility-administered encounter medications on file as of 03/05/2021.    Patient Active Problem List   Diagnosis Date Noted   H/O adenomatous polyp of colon 02/18/2020   Failed conscious sedation during procedure 02/18/2020   Morbid obesity ([place]) 10/30/2012   Diabetes mellitus    Hypercholesteremia    Hypertension     Past Medical History:  Diagnosis Date   Bronchitis    Diabetes mellitus    Hypercholesteremia    Hypertension    Kidney stones    Morbid obesity ([place]) 10/30/2012    Relevant past medical, surgical, family and social history reviewed and updated as indicated. Interim medical history since our last visit reviewed.  Review of Systems Per HPI unless specifically indicated above     Objective:    There were no vitals taken for this visit.  Wt Readings from Last 3 Encounters:  03/23/20 (!) 304 lb 14.3 oz (138.3 kg)  03/21/20 (!) 305 lb (138.3 kg)  03/20/20 (!) 323 lb (146.5 kg)    Physical Exam Vitals and nursing note reviewed.  Constitutional:      General: He is not in acute distress.    Appearance: He is not  ill-appearing or toxic-appearing.  HENT:     Right Ear: External ear normal.     Left Ear: External ear normal.     Nose: Congestion present. No rhinorrhea.     Mouth/Throat:     Mouth: Mucous membranes are moist.     Pharynx: Oropharynx is clear.  Eyes:     General: No scleral icterus.       Right eye: No discharge.        Left eye: No discharge.     Extraocular Movements: Extraocular movements intact.  Cardiovascular:     Comments: Unable to assess heart sounds via virtual visit Pulmonary:     Effort: Pulmonary effort is normal. No respiratory distress.     Comments: Unable to assess lung sounds via virtual  visit.  Patient talking in complete sentences during telemedicine visit.  No accessory muscle use. Skin:    Coloration: Skin is not jaundiced or pale.     Findings: No erythema.  Neurological:     Mental Status: He is alert and oriented to person, place, and time.      Assessment & Plan:  1. Upper respiratory tract infection, unspecified type Acute.  Suspect viral etiology.  Obtain viral testing.  Reassured patient that symptoms and exam findings are most consistent with a viral upper respiratory infection and explained lack of efficacy of antibiotics against viruses.  Discussed expected course and features suggestive of secondary bacterial infection.  Continue supportive care. Increase fluid intake with water or electrolyte solution like pedialyte. Encouraged acetaminophen as needed for fever/pain. Encouraged salt water gargling, chloraseptic spray and throat lozenges. Encouraged OTC guaifenesin. Encouraged saline sinus flushes and/or neti with humidified air.  Encouraged OTC Coricidin products to help with symptoms.  Patient with significant history of cigar smoking - obtain chest x-ray and start prednisone for possible underlying chronic bronchitis.  Also, if influenza is cause of symptoms, he is high risk.  Highly suspicious in wife for influenza.  Will empirically also treat him.  Follow up with no improvement later this week.  - SARS-CoV-2 RNA (COVID-19) and Respiratory Viral Panel, Qualitative NAAT - DG Chest 2 View; Future - predniSONE (DELTASONE) 20 MG tablet; Take 2 tablets (40 mg total) by mouth daily with breakfast.  Dispense: 10 tablet; Refill: 0 - oseltamivir (TAMIFLU) 75 MG capsule; Take 1 capsule (75 mg total) by mouth 2 (two) times daily.  Dispense: 10 capsule; Refill: 0  2. Cigar smoker unmotivated to quit    Follow up plan: Return if symptoms worsen or fail to improve.  Due to the catastrophic nature of the COVID-19 pandemic, this video visit was completed soley via audio  and visual contact via Caregility due to the restrictions of the COVID-19 pandemic.  All issues as above were discussed and addressed. Physical exam was done as above through visual confirmation on Caregility. If it was felt that the patient should be evaluated in the office, they were directed there. The patient verbally consented to this visit. Location of the patient: home Location of the provider: work Those involved with this call:  Provider: Noemi Chapel, DNP, FNP-C CMA: n/a Front Desk/Registration: Santina Evans  Time spent on call:  11 minutes with patient face to face via video conference. More than 50% of this time was spent in counseling and coordination of care. 15 minutes total spent in review of patient's record and preparation of their chart. I verified patient identity using two factors (patient name and date of birth). Patient consents verbally  to being seen via telemedicine visit today.

## 2021-03-08 LAB — SARS-COV-2 RNA (COVID-19) RESP VIRAL PNL QL NAAT

## 2021-03-08 MED ORDER — MOLNUPIRAVIR EUA 200MG CAPSULE: 4.0000 | ORAL_CAPSULE | Freq: Two times a day (BID) | ORAL | 0 refills | Status: AC

## 2021-03-08 NOTE — Addendum Note (Signed)
Addended by: Noemi Chapel A on: 03/08/2021 04:46 PM   Modules accepted: Orders

## 2021-03-15 ENCOUNTER — Telehealth: Payer: Self-pay

## 2021-03-15 NOTE — Telephone Encounter (Signed)
LMTRC   Pt can schedule OV with Dr Dennard Schaumann or Velora Mediate for tomorrow

## 2021-03-15 NOTE — Telephone Encounter (Signed)
Spoke with pt and he states he tested negative for COVID however his wife was positive. He has been taking Molnupiravir and has improved some. He does report continued nasal congestion and cough. He denies fevers/chills or shob. Pt been taking Coricidin but not in the past few days - recommend he restart. Pt is concerned about developing bronchitis and would like to have abtx called in.  Please advise, thanks!

## 2021-03-15 NOTE — Telephone Encounter (Signed)
Pt's spouse called in asking if pt could get some anitbiotic sent to pharmacy. Pt's spouse stated that pt is prone to get bronchitis and congestion does not seem to be getting any better. Please advise.  Cb#: [phone]

## 2021-03-16 ENCOUNTER — Ambulatory Visit (INDEPENDENT_AMBULATORY_CARE_PROVIDER_SITE_OTHER): Payer: BLUE CROSS/BLUE SHIELD | Admitting: Family Medicine

## 2021-03-16 ENCOUNTER — Encounter: Payer: Self-pay | Admitting: Family Medicine

## 2021-03-16 ENCOUNTER — Other Ambulatory Visit: Payer: Self-pay

## 2021-03-16 VITALS — BP 118/88 | HR 86 | Resp 18 | Ht 71.0 in | Wt 313.0 lb

## 2021-03-16 DIAGNOSIS — J440 Chronic obstructive pulmonary disease with acute lower respiratory infection: Secondary | ICD-10-CM | POA: Diagnosis not present

## 2021-03-16 DIAGNOSIS — J209 Acute bronchitis, unspecified: Secondary | ICD-10-CM | POA: Diagnosis not present

## 2021-03-16 MED ORDER — AZITHROMYCIN 250 MG PO TABS: ORAL_TABLET | ORAL | 0 refills | Status: DC

## 2021-03-16 MED ORDER — PREDNISONE 20 MG PO TABS: ORAL_TABLET | ORAL | 0 refills | Status: DC

## 2021-03-16 NOTE — Telephone Encounter (Signed)
Pt scheduled for OV today.  

## 2021-03-16 NOTE — Progress Notes (Signed)
Subjective:    Patient ID: Eric Johnson, male    DOB: 1960/08/05, 60 y.o.   MRN: [id number]  HPI  Patient tested positive for COVID around November 18.  Continues to have head congestion and rhinorrhea sinus pressure particularly above his left eye cough and wheezing.  He tested negative for COVID last week.  He has had all of his COVID vaccinations and even took molnupiravir.  He denies any chest pain but he does report wheezing and cough and shortness of breath.  The cough is productive of yellow mucus.  He has a history of COPD. Past Medical History:  Diagnosis Date   Bronchitis    Diabetes mellitus    Hypercholesteremia    Hypertension    Kidney stones    Morbid obesity ([place]) 10/30/2012   Past Surgical History:  Procedure Laterality Date   COLONOSCOPY N/A 08/27/2013   Dr. Gala Romney: Multiple tubular adenomas removed.  Next colonoscopy 5 years.   COLONOSCOPY W/ POLYPECTOMY  2000   COLONOSCOPY WITH PROPOFOL N/A 03/23/2020   Procedure: COLONOSCOPY WITH PROPOFOL;  Surgeon: Daneil Dolin, MD;  Location: [facility];  Service: Endoscopy;  Laterality: N/A;  9:45am   kidney stone removal     POLYPECTOMY  03/23/2020   Procedure: POLYPECTOMY;  Surgeon: Daneil Dolin, MD;  Location: [facility];  Service: Endoscopy;;   Current Outpatient Medications on File Prior to Visit  Medication Sig Dispense Refill   aspirin 81 MG chewable tablet Chew 81 mg by mouth daily.     atorvastatin (LIPITOR) 40 MG tablet TAKE 1 TABLET(40 MG) BY MOUTH DAILY 90 tablet 1   beta carotene w/minerals (OCUVITE) tablet Take 1 tablet by mouth daily.     Cyanocobalamin (VITAMIN B 12) 500 MCG TABS Take 500 mcg by mouth daily.     hydrochlorothiazide (HYDRODIURIL) 25 MG tablet TAKE 1 TABLET BY MOUTH DAILY 90 tablet 0   metFORMIN (GLUCOPHAGE) 500 MG tablet TAKE 2 TABLETS(1000 MG) BY MOUTH TWICE DAILY WITH A MEAL 120 tablet 2   Multiple Vitamins-Minerals (MULTIVITAMIN MEN 50+) TABS Take 1 tablet by mouth daily.      Omega-3-6-9 CAPS Take 1 capsule by mouth daily.     saw palmetto 160 MG capsule Take 160 mg by mouth daily.      No current facility-administered medications on file prior to visit.   Allergies  Allergen Reactions   Losartan Swelling   Penicillins     Childhood - unknown    Social History   Socioeconomic History   Marital status: Married    Spouse name: Not on file   Number of children: Not on file   Years of education: Not on file   Highest education level: Not on file  Occupational History   Not on file  Tobacco Use   Smoking status: Every Day    Types: Cigars   Smokeless tobacco: Former    Types: Snuff   Tobacco comments:    Smokes at least 2 cigars daily  Substance and Sexual Activity   Alcohol use: Yes    Alcohol/week: 0.0 standard drinks    Comment: Occ wine   Drug use: No   Sexual activity: Not on file  Other Topics Concern   Not on file  Social History Narrative   Not on file   Social Determinants of Health   Financial Resource Strain: Not on file  Food Insecurity: Not on file  Transportation Needs: Not on file  Physical Activity: Not  on file  Stress: Not on file  Social Connections: Not on file  Intimate Partner Violence: Not on file   Family History  Problem Relation Age of Onset   Heart Problems Mother    Colon cancer Neg Hx       Review of Systems  All other systems reviewed and are negative.     Objective:   Physical Exam Vitals reviewed.  Constitutional:      General: He is not in acute distress.    Appearance: He is well-developed. He is not diaphoretic.  HENT:     Head: Normocephalic and atraumatic.     Right Ear: External ear normal.     Left Ear: External ear normal.     Nose: Congestion and rhinorrhea present.     Right Sinus: No maxillary sinus tenderness or frontal sinus tenderness.     Left Sinus: Frontal sinus tenderness present.     Mouth/Throat:     Pharynx: No oropharyngeal exudate.  Eyes:     General: No scleral  icterus.       Right eye: No discharge.        Left eye: No discharge.     Conjunctiva/sclera: Conjunctivae normal.     Pupils: Pupils are equal, round, and reactive to light.  Neck:     Thyroid: No thyromegaly.     Vascular: No JVD.     Trachea: No tracheal deviation.  Cardiovascular:     Rate and Rhythm: Normal rate and regular rhythm.     Heart sounds: Normal heart sounds. No murmur heard.   No friction rub. No gallop.  Pulmonary:     Effort: Pulmonary effort is normal. No respiratory distress.     Breath sounds: Normal breath sounds. No stridor. No wheezing or rales.  Chest:     Chest wall: No tenderness.  Abdominal:     General: Bowel sounds are normal. There is no distension.     Palpations: Abdomen is soft. There is no mass.     Tenderness: There is no abdominal tenderness. There is no guarding or rebound.  Musculoskeletal:        General: No tenderness or deformity. Normal range of motion.     Cervical back: Normal range of motion and neck supple.  Lymphadenopathy:     Cervical: No cervical adenopathy.  Neurological:     Mental Status: He is alert and oriented to person, place, and time.     Cranial Nerves: No cranial nerve deficit.     Motor: No abnormal muscle tone.     Coordination: Coordination normal.     Deep Tendon Reflexes: Reflexes are normal and symmetric.  Psychiatric:        Behavior: Behavior normal.        Thought Content: Thought content normal.        Judgment: Judgment normal.         Assessment & Plan:  Acute bronchitis with COPD ([place]) Believe the patient is likely having COPD exacerbation and sinusitis secondary to COVID.  Begin a Z-Pak coupled with a prednisone taper pack and then reassess in 1 week or sooner if worse.

## 2021-03-19 ENCOUNTER — Encounter: Payer: Self-pay | Admitting: Family Medicine

## 2021-03-19 NOTE — Telephone Encounter (Signed)
Please advise on work note. Thanks!

## 2021-03-28 ENCOUNTER — Other Ambulatory Visit: Payer: Self-pay | Admitting: Family Medicine

## 2021-03-29 ENCOUNTER — Encounter: Payer: Self-pay | Admitting: Family Medicine

## 2021-04-06 ENCOUNTER — Other Ambulatory Visit: Payer: Self-pay | Admitting: Family Medicine

## 2021-04-06 DIAGNOSIS — I10 Essential (primary) hypertension: Secondary | ICD-10-CM

## 2021-05-01 ENCOUNTER — Other Ambulatory Visit: Payer: Self-pay | Admitting: Family Medicine

## 2021-05-27 ENCOUNTER — Other Ambulatory Visit: Payer: Self-pay | Admitting: Family Medicine

## 2021-07-24 ENCOUNTER — Ambulatory Visit (INDEPENDENT_AMBULATORY_CARE_PROVIDER_SITE_OTHER): Payer: BLUE CROSS/BLUE SHIELD | Admitting: Family Medicine

## 2021-07-24 ENCOUNTER — Encounter: Payer: Self-pay | Admitting: Family Medicine

## 2021-07-24 VITALS — BP 118/80 | HR 84 | Temp 98.2°F | Ht 71.0 in | Wt 316.0 lb

## 2021-07-24 DIAGNOSIS — Z0001 Encounter for general adult medical examination with abnormal findings: Secondary | ICD-10-CM

## 2021-07-24 DIAGNOSIS — Z23 Encounter for immunization: Secondary | ICD-10-CM | POA: Diagnosis not present

## 2021-07-24 DIAGNOSIS — I10 Essential (primary) hypertension: Secondary | ICD-10-CM | POA: Diagnosis not present

## 2021-07-24 DIAGNOSIS — F17200 Nicotine dependence, unspecified, uncomplicated: Secondary | ICD-10-CM

## 2021-07-24 DIAGNOSIS — E118 Type 2 diabetes mellitus with unspecified complications: Secondary | ICD-10-CM | POA: Diagnosis not present

## 2021-07-24 DIAGNOSIS — Z122 Encounter for screening for malignant neoplasm of respiratory organs: Secondary | ICD-10-CM

## 2021-07-24 DIAGNOSIS — Z125 Encounter for screening for malignant neoplasm of prostate: Secondary | ICD-10-CM

## 2021-07-24 DIAGNOSIS — E7800 Pure hypercholesterolemia, unspecified: Secondary | ICD-10-CM | POA: Diagnosis not present

## 2021-07-24 NOTE — Addendum Note (Signed)
Addended by: Colman Cater on: 07/24/2021 10:48 AM ? ? Modules accepted: Orders ? ?

## 2021-07-24 NOTE — Progress Notes (Signed)
? ?Subjective:  ? ? Patient ID: Eric Johnson, male    DOB: 07-30-1960, 61 y.o.   MRN: [id number] ? ?HPI ?Last saw the patient in 2021.  He is here today for complete physical exam.  His blood pressure is excellent at 118/80 however his BMI is elevated at 44 and his weight is 316 pounds.  He is overdue for hemoglobin A1c to monitor the management of his diabetes.  He has never been tried on a GLP-1 agonist such as Ozempic which could help facilitate weight loss.  Reviewing his immunizations, he is due for a tetanus shot.  He is also due for Prevnar 20 but because of his smoking history.  He has had Shingrix recently.  He is due for the COVID-19 booster.  He is overdue for prostate cancer screening.  His last PSA was in 2021.  His last colonoscopy was in 2021.  They found tubular adenomas and recommended a repeat colonoscopy in 5 years (2026).  He is overdue for lung cancer screening. ?Immunization History  ?Administered Date(s) Administered  ? HPV Quadrivalent 01/08/2019  ? Influenza Split 04/21/2009  ? Influenza,inj,Quad PF,6+ Mos 12/25/2012, 01/03/2017  ? Influenza-Unspecified 01/05/2016, 01/02/2018, 01/07/2020  ? PFIZER(Purple Top)SARS-COV-2 Vaccination 06/12/2019, 07/07/2019, 03/14/2020  ? Pneumococcal Polysaccharide-23 06/13/2015  ? Tdap 04/21/2009  ? Zoster, Live 10/30/2012  ? ? ?Past Medical History:  ?Diagnosis Date  ? Bronchitis   ? Diabetes mellitus   ? Hypercholesteremia   ? Hypertension   ? Kidney stones   ? Morbid obesity ([place]) 10/30/2012  ? ?Past Surgical History:  ?Procedure Laterality Date  ? COLONOSCOPY N/A 08/27/2013  ? Dr. Gala Romney: Multiple tubular adenomas removed.  Next colonoscopy 5 years.  ? COLONOSCOPY W/ POLYPECTOMY  2000  ? COLONOSCOPY WITH PROPOFOL N/A 03/23/2020  ? Procedure: COLONOSCOPY WITH PROPOFOL;  Surgeon: Daneil Dolin, MD;  [facility];  Service: Endoscopy;  Laterality: N/A;  9:45am  ? kidney stone removal    ? POLYPECTOMY  03/23/2020  ? Procedure: POLYPECTOMY;  Surgeon:  Daneil Dolin, MD;  [facility]: [facility];  Service: Endoscopy;;  ? ?Current Outpatient Medications on File Prior to Visit  ?Medication Sig Dispense Refill  ? aspirin 81 MG chewable tablet Chew 81 mg by mouth daily.    ? atorvastatin (LIPITOR) 40 MG tablet TAKE 1 TABLET(40 MG) BY MOUTH DAILY 90 tablet 3  ? beta carotene w/minerals (OCUVITE) tablet Take 1 tablet by mouth daily.    ? Cyanocobalamin (VITAMIN B 12) 500 MCG TABS Take 500 mcg by mouth daily.    ? hydrochlorothiazide (HYDRODIURIL) 25 MG tablet TAKE 1 TABLET BY MOUTH DAILY 90 tablet 3  ? metFORMIN (GLUCOPHAGE) 500 MG tablet TAKE 2 TABLETS(1000 MG) BY MOUTH TWICE DAILY WITH A MEAL 120 tablet 2  ? Multiple Vitamins-Minerals (MULTIVITAMIN MEN 50+) TABS Take 1 tablet by mouth daily.    ? Omega-3-6-9 CAPS Take 1 capsule by mouth daily.    ? saw palmetto 160 MG capsule Take 160 mg by mouth daily.     ? ?No current facility-administered medications on file prior to visit.  ? ?Allergies  ?Allergen Reactions  ? Losartan Swelling  ? Penicillins   ?  Childhood - unknown   ? ?Social History  ? ?Socioeconomic History  ? Marital status: Married  ?  Spouse name: Not on file  ? Number of children: Not on file  ? Years of education: Not on file  ? Highest education level: Not on file  ?Occupational  History  ? Not on file  ?Tobacco Use  ? Smoking status: Every Day  ?  Types: Cigars  ? Smokeless tobacco: Former  ?  Types: Snuff  ? Tobacco comments:  ?  Smokes at least 2 cigars daily  ?Substance and Sexual Activity  ? Alcohol use: Yes  ?  Alcohol/week: 0.0 standard drinks  ?  Comment: Occ wine  ? Drug use: No  ? Sexual activity: Not on file  ?Other Topics Concern  ? Not on file  ?Social History Narrative  ? Not on file  ? ?Social Determinants of Health  ? ?Financial Resource Strain: Not on file  ?Food Insecurity: Not on file  ?Transportation Needs: Not on file  ?Physical Activity: Not on file  ?Stress: Not on file  ?Social Connections: Not on file  ?Intimate Partner  Violence: Not on file  ? ?Family History  ?Problem Relation Age of Onset  ? Heart Problems Mother   ? Colon cancer Neg Hx   ? ? ? ? ?Review of Systems  ?All other systems reviewed and are negative. ? ?   ?Objective:  ? Physical Exam ?Vitals reviewed.  ?Constitutional:   ?   General: He is not in acute distress. ?   Appearance: He is well-developed. He is not diaphoretic.  ?HENT:  ?   Head: Normocephalic and atraumatic.  ?   Right Ear: External ear normal.  ?   Left Ear: External ear normal.  ?   Nose: Nose normal.  ?   Mouth/Throat:  ?   Pharynx: No oropharyngeal exudate.  ?Eyes:  ?   General: No scleral icterus.    ?   Right eye: No discharge.     ?   Left eye: No discharge.  ?   Conjunctiva/sclera: Conjunctivae normal.  ?   Pupils: Pupils are equal, round, and reactive to light.  ?Neck:  ?   Thyroid: No thyromegaly.  ?   Vascular: No JVD.  ?   Trachea: No tracheal deviation.  ?Cardiovascular:  ?   Rate and Rhythm: Normal rate and regular rhythm.  ?   Heart sounds: Normal heart sounds. No murmur heard. ?  No friction rub. No gallop.  ?Pulmonary:  ?   Effort: Pulmonary effort is normal. No respiratory distress.  ?   Breath sounds: Normal breath sounds. No stridor. No wheezing or rales.  ?Chest:  ?   Chest wall: No tenderness.  ?Abdominal:  ?   General: Bowel sounds are normal. There is no distension.  ?   Palpations: Abdomen is soft. There is no mass.  ?   Tenderness: There is no abdominal tenderness. There is no guarding or rebound.  ?Musculoskeletal:     ?   General: No tenderness or deformity. Normal range of motion.  ?   Cervical back: Normal range of motion and neck supple.  ?Lymphadenopathy:  ?   Cervical: No cervical adenopathy.  ?Neurological:  ?   Mental Status: He is alert and oriented to person, place, and time.  ?   Cranial Nerves: No cranial nerve deficit.  ?   Motor: No abnormal muscle tone.  ?   Coordination: Coordination normal.  ?   Deep Tendon Reflexes: Reflexes are normal and symmetric.   ?Psychiatric:     ?   Behavior: Behavior normal.     ?   Thought Content: Thought content normal.     ?   Judgment: Judgment normal.  ? ? ? ? ?   ?  Assessment & Plan:  ?Diabetes mellitus with complication in adult patient [facility]) - Plan: Hemoglobin A1c ? ?Hypercholesteremia - Plan: Lipid panel ? ?Benign essential HTN - Plan: CBC with Differential/Platelet, Lipid panel, COMPLETE METABOLIC PANEL WITH GFR ? ?Prostate cancer screening - Plan: PSA ? ?Smoker - Plan: CT CHEST LUNG CA SCREEN LOW DOSE W/O CM ? ?Screening for lung cancer - Plan: CT CHEST LUNG CA SCREEN LOW DOSE W/O CM ? ?Patient's immunizations are up-to-date.  Has had the shingles vaccine.  He received Prevnar 20 today and he also received his tetanus shot.  His colonoscopy is not due again until 2026 because of his history of tubular adenomas.  I will check a PSA today to screen for prostate cancer.  I will check a hemoglobin A1c.  Goal hemoglobin A1c is less than 6.5.  I would recommend trying Ozempic to help manage his sugars and facilitate weight loss if the patient is interested.  Await the results of his lab work prior to starting the medication.  Check a fasting lipid panel.  Goal LDL cholesterol is less than 100.  Schedule patient for a CT scan to screen for lung cancer.  Blood pressure is acceptable.  Continue to encourage smoking cessation. ?

## 2021-07-25 LAB — COMPLETE METABOLIC PANEL WITH GFR
AG Ratio: 1.6 (calc) (ref 1.0–2.5)
ALT: 21 U/L (ref 9–46)
AST: 17 U/L (ref 10–35)
Albumin: 4.1 g/dL (ref 3.6–5.1)
Alkaline phosphatase (APISO): 56 U/L (ref 35–144)
BUN: 15 mg/dL (ref 7–25)
CO2: 30 mmol/L (ref 20–32)
Calcium: 9.6 mg/dL (ref 8.6–10.3)
Chloride: 103 mmol/L (ref 98–110)
Creat: 1.14 mg/dL (ref 0.70–1.35)
Globulin: 2.5 g/dL (calc) (ref 1.9–3.7)
Glucose, Bld: 113 mg/dL — ABNORMAL HIGH (ref 65–99)
Potassium: 4.1 mmol/L (ref 3.5–5.3)
Sodium: 140 mmol/L (ref 135–146)
Total Bilirubin: 0.5 mg/dL (ref 0.2–1.2)
Total Protein: 6.6 g/dL (ref 6.1–8.1)
eGFR: 73 mL/min/{1.73_m2} (ref >=60)

## 2021-07-25 LAB — CBC WITH DIFFERENTIAL/PLATELET
Absolute Monocytes: 513 cells/uL (ref 200–950)
Basophils Absolute: 32 cells/uL (ref 0–200)
Basophils Relative: 0.6 %
Eosinophils Absolute: 119 cells/uL (ref 15–500)
Eosinophils Relative: 2.2 %
HCT: 43.1 % (ref 38.5–50.0)
Hemoglobin: 14.9 g/dL (ref 13.2–17.1)
Lymphs Abs: 1215 cells/uL (ref 850–3900)
MCH: 32.7 pg (ref 27.0–33.0)
MCHC: 34.6 g/dL (ref 32.0–36.0)
MCV: 94.7 fL (ref 80.0–100.0)
MPV: 10.8 fL (ref 7.5–12.5)
Monocytes Relative: 9.5 %
Neutro Abs: 3521 cells/uL (ref 1500–7800)
Neutrophils Relative %: 65.2 %
Platelets: 221 10*3/uL (ref 140–400)
RBC: 4.55 10*6/uL (ref 4.20–5.80)
RDW: 12.9 % (ref 11.0–15.0)
Total Lymphocyte: 22.5 %
WBC: 5.4 10*3/uL (ref 3.8–10.8)

## 2021-07-25 LAB — LIPID PANEL
Cholesterol: 106 mg/dL (ref <200)
HDL: 37 mg/dL — ABNORMAL LOW (ref >=40)
LDL Cholesterol (Calc): 47 mg/dL (calc)
Non-HDL Cholesterol (Calc): 69 mg/dL (calc) (ref <130)
Total CHOL/HDL Ratio: 2.9 (calc) (ref <5.0)
Triglycerides: 139 mg/dL (ref <150)

## 2021-07-25 LAB — PSA: PSA: 0.65 ng/mL (ref <=4.00)

## 2021-07-25 LAB — HEMOGLOBIN A1C
Hgb A1c MFr Bld: 6.3 % of total Hgb — ABNORMAL HIGH (ref <5.7)
Mean Plasma Glucose: 134 mg/dL
eAG (mmol/L): 7.4 mmol/L

## 2021-07-27 ENCOUNTER — Other Ambulatory Visit: Payer: Self-pay

## 2021-07-27 ENCOUNTER — Encounter: Payer: Self-pay | Admitting: Family Medicine

## 2021-07-27 MED ORDER — OZEMPIC (0.25 OR 0.5 MG/DOSE) 2 MG/1.5ML ~~LOC~~ SOPN: 0.5000 mg | PEN_INJECTOR | SUBCUTANEOUS | 0 refills | Status: AC

## 2021-07-27 NOTE — Telephone Encounter (Signed)
Already spoke with pt this morning regarding meds. Per pt wanted to try Ozempic. Rx sent to mail order pharmacy ?

## 2021-08-08 ENCOUNTER — Telehealth: Payer: Self-pay

## 2021-08-08 NOTE — Telephone Encounter (Signed)
Earlie Lou (Key: [id number]) ?Rx #: [id number] ?Ozempic (0.25 or 0.5 MG/DOSE) '2MG'$ /3ML pen-injectors ? ?PA Send to plan ?

## 2021-08-08 NOTE — Progress Notes (Signed)
Error. See encounter 08/08/21 ?

## 2021-08-13 ENCOUNTER — Other Ambulatory Visit: Payer: Self-pay

## 2021-08-13 ENCOUNTER — Ambulatory Visit
Admission: RE | Admit: 2021-08-13 | Discharge: 2021-08-13 | Disposition: A | Discharge status: Home / Self Care | Payer: BLUE CROSS/BLUE SHIELD | Source: Ambulatory Visit | Attending: Family Medicine | Admitting: Family Medicine

## 2021-08-13 DIAGNOSIS — F17200 Nicotine dependence, unspecified, uncomplicated: Secondary | ICD-10-CM

## 2021-08-13 DIAGNOSIS — Z122 Encounter for screening for malignant neoplasm of respiratory organs: Secondary | ICD-10-CM

## 2021-08-13 NOTE — Progress Notes (Signed)
Cancel old order due to insurance. pt only smokes cigars, PER DRI should be CT chest w/o so that it can be cover by insurance.  ?

## 2021-08-16 NOTE — Telephone Encounter (Signed)
    Message from Plan Available without authorization.  Eric Johnson (Key: [id number]) Rx #: [id number] Ozempic (0.25 or 0.5 MG/DOSE) '2MG'$ /3ML pen-injectors

## 2021-08-22 ENCOUNTER — Other Ambulatory Visit: Payer: Self-pay

## 2021-08-22 DIAGNOSIS — Z122 Encounter for screening for malignant neoplasm of respiratory organs: Secondary | ICD-10-CM

## 2021-08-29 ENCOUNTER — Other Ambulatory Visit: Payer: Self-pay | Admitting: Family Medicine

## 2021-08-30 NOTE — Telephone Encounter (Signed)
Requested Prescriptions  Pending Prescriptions Disp Refills  . metFORMIN (GLUCOPHAGE) 500 MG tablet [Pharmacy Med Name: METFORMIN 500MG TABLETS] 120 tablet 2    Sig: TAKE 2 TABLETS(1000 MG) BY MOUTH TWICE DAILY WITH A MEAL     Endocrinology:  Diabetes - Biguanides Failed - 08/29/2021  8:49 AM      Failed - B12 Level in normal range and within 720 days    No results found for: VITAMINB12       Passed - Cr in normal range and within 360 days    Creat  Date Value Ref Range Status  07/24/2021 1.14 0.70 - 1.35 mg/dL Final         Passed - HBA1C is between 0 and 7.9 and within 180 days    Hgb A1c MFr Bld  Date Value Ref Range Status  07/24/2021 6.3 (H) <5.7 % of total Hgb Final    Comment:    For someone without known diabetes, a hemoglobin  A1c value between 5.7% and 6.4% is consistent with prediabetes and should be confirmed with a  follow-up test. . For someone with known diabetes, a value <7% indicates that their diabetes is well controlled. A1c targets should be individualized based on duration of diabetes, age, comorbid conditions, and other considerations. . This assay result is consistent with an increased risk of diabetes. . Currently, no consensus exists regarding use of hemoglobin A1c for diagnosis of diabetes for children. .          Passed - eGFR in normal range and within 360 days    GFR, Est African American  Date Value Ref Range Status  03/20/2020 97 > OR = 60 mL/min/1.53m Final   GFR, Est Non African American  Date Value Ref Range Status  03/20/2020 84 > OR = 60 mL/min/1.776mFinal   eGFR  Date Value Ref Range Status  07/24/2021 73 > OR = 60 mL/min/1.7324minal    Comment:    The eGFR is based on the CKD-EPI 2021 equation. To calculate  the new eGFR from a previous Creatinine or Cystatin C result, go to https://www.kidney.org/professionals/ kdoqi/gfr%5Fcalculator          Passed - Valid encounter within last 6 months    Recent Outpatient Visits           1 month ago [facility] adult [facility]   5 months ago Acute bronchitis with COPD ([facility], WarCammie McgeeD   5 months ago Upper respiratory tract infection, unspecified type   [facility] Bear[facility]   1 year ago General medical exam   [facility]   1 year ago [facility], WarCammie McgeeD             Passed - CBC within normal limits and completed in the last 12 months    WBC  Date Value Ref Range Status  07/24/2021 5.4 3.8 - 10.8 Thousand/uL Final   RBC  Date Value Ref Range Status  07/24/2021 4.55 4.20 - 5.80 Million/uL Final   Hemoglobin  Date Value Ref Range Status  07/24/2021 14.9 13.2 - 17.1 g/dL Final   HCT  Date Value Ref Range Status  07/24/2021 43.1 38.5 - 50.0 % Final   MCHC  Date Value  Ref Range Status  07/24/2021 34.6 32.0 - 36.0 g/dL Final   [facility]  Date Value Ref Range Status  07/24/2021 32.7 27.0 - 33.0 pg Final   MCV  Date Value Ref Range Status  07/24/2021 94.7 80.0 - 100.0 fL Final   No results found for: PLTCOUNTKUC, LABPLAT, POCPLA RDW  Date Value Ref Range Status  07/24/2021 12.9 11.0 - 15.0 % Final

## 2021-10-08 ENCOUNTER — Encounter: Payer: Self-pay | Admitting: Family Medicine

## 2021-10-09 ENCOUNTER — Telehealth: Payer: Self-pay

## 2021-10-09 ENCOUNTER — Other Ambulatory Visit: Payer: Self-pay

## 2021-10-09 DIAGNOSIS — I10 Essential (primary) hypertension: Secondary | ICD-10-CM

## 2021-10-09 MED ORDER — HYDROCHLOROTHIAZIDE 25 MG PO TABS: 25.0000 mg | ORAL_TABLET | Freq: Every day | ORAL | 3 refills | Status: DC

## 2021-10-09 MED ORDER — ATORVASTATIN CALCIUM 40 MG PO TABS: ORAL_TABLET | ORAL | 3 refills | Status: DC

## 2021-10-09 MED ORDER — METFORMIN HCL 500 MG PO TABS: ORAL_TABLET | ORAL | 2 refills | Status: DC

## 2021-10-09 NOTE — Telephone Encounter (Signed)
Pt sent a messg via mychart to request refills for the following meds to be [facility] to [facility] [place].  atorvastatin (LIPITOR) 40 MG tablet [[id number]]  hydrochlorothiazide (HYDRODIURIL) 25 MG tablet  metFORMIN (GLUCOPHAGE) 500 MG tablet [[id number]]    Cb#: [phone]

## 2021-10-10 ENCOUNTER — Other Ambulatory Visit: Payer: Self-pay

## 2021-10-10 DIAGNOSIS — I10 Essential (primary) hypertension: Secondary | ICD-10-CM

## 2021-10-10 MED ORDER — METFORMIN HCL 500 MG PO TABS: ORAL_TABLET | ORAL | 2 refills | Status: DC

## 2021-10-10 MED ORDER — ATORVASTATIN CALCIUM 40 MG PO TABS: ORAL_TABLET | ORAL | 3 refills | Status: DC

## 2021-10-10 MED ORDER — HYDROCHLOROTHIAZIDE 25 MG PO TABS: 25.0000 mg | ORAL_TABLET | Freq: Every day | ORAL | 3 refills | Status: DC

## 2022-01-23 ENCOUNTER — Other Ambulatory Visit: Payer: Self-pay

## 2022-01-23 NOTE — Telephone Encounter (Signed)
Patient called to verify he [facility] [place] [place]. He says yes they have moved there and is still in the process of finding a new provider. Until then, he will need his medication sent there.

## 2022-01-23 NOTE — Telephone Encounter (Signed)
Pharmacy faxed a refill or prescription renewal for: metFORMIN (GLUCOPHAGE) 500 MG tablet [[id number]]    Order Details Dose, Route, Frequency: As Directed  Dispense Quantity: 120 tablet Refills: 2        Sig: TAKE 2 TABLETS(1000 MG) BY MOUTH TWICE DAILY WITH A MEAL       Start Date: 10/10/21 End Date: --  Written Date: 10/10/21 Expiration Date: 10/10/22      LOV: 07/24/21   PHARMACY: [facility] [address], [place] [address]

## 2022-01-24 MED ORDER — METFORMIN HCL 500 MG PO TABS: ORAL_TABLET | ORAL | 2 refills | Status: DC

## 2022-01-24 NOTE — Telephone Encounter (Signed)
Requested medication (s) are due for refill today - yes  Requested medication (s) are on the active medication list -yes  Future visit scheduled -no  Last refill: 10/10/21 #120 2RF  Notes to clinic: Patient is due 6 month follow up- but has moved out of town. Patient is requesting RF until he establishes care- sent to provider to determine length of RF.  Requested Prescriptions  Pending Prescriptions Disp Refills   metFORMIN (GLUCOPHAGE) 500 MG tablet 120 tablet 2    Sig: TAKE 2 TABLETS(1000 MG) BY MOUTH TWICE DAILY WITH A MEAL     Endocrinology:  Diabetes - Biguanides Failed - 01/23/2022  1:47 PM      Failed - HBA1C is between 0 and 7.9 and within 180 days    Hgb A1c MFr Bld  Date Value Ref Range Status  07/24/2021 6.3 (H) <5.7 % of total Hgb Final    Comment:    For someone without known diabetes, a hemoglobin  A1c value between 5.7% and 6.4% is consistent with prediabetes and should be confirmed with a  follow-up test. . For someone with known diabetes, a value <7% indicates that their diabetes is well controlled. A1c targets should be individualized based on duration of diabetes, age, comorbid conditions, and other considerations. . This assay result is consistent with an increased risk of diabetes. . Currently, no consensus exists regarding use of hemoglobin A1c for diagnosis of diabetes for children. .          Failed - B12 Level in normal range and within 720 days    No results found for: "VITAMINB12"       Failed - Valid encounter within last 6 months    Recent Outpatient Visits           6 months ago Diabetes mellitus with complication in adult patient [facility])   [place] Eric Frizzle, MD   10 months ago Acute bronchitis with COPD ([place])   [name], Eric Mcgee, MD   10 months ago Upper respiratory tract infection, unspecified type   [place] Eric Bear, NP   1 year ago General  medical exam   [place] Eric Frizzle, MD   2 years ago Diabetes mellitus with complication in adult patient [facility])   [place] [name], Eric Mcgee, MD              Passed - Cr in normal range and within 360 days    Creat  Date Value Ref Range Status  07/24/2021 1.14 0.70 - 1.35 mg/dL Final         Passed - eGFR in normal range and within 360 days    GFR, Est African American  Date Value Ref Range Status  03/20/2020 97 > OR = 60 mL/min/1.11m Final   GFR, Est Non African American  Date Value Ref Range Status  03/20/2020 84 > OR = 60 mL/min/1.749mFinal   eGFR  Date Value Ref Range Status  07/24/2021 73 > OR = 60 mL/min/1.7349minal    Comment:    The eGFR is based on the CKD-EPI 2021 equation. To calculate  the new eGFR from a previous Creatinine or Cystatin C result, go to https://www.kidney.org/professionals/ kdoqi/gfr%5Fcalculator          Passed - CBC within normal limits and completed in the last 12 months    WBC  Date Value Ref Range Status  07/24/2021 5.4  3.8 - 10.8 Thousand/uL Final   RBC  Date Value Ref Range Status  07/24/2021 4.55 4.20 - 5.80 Million/uL Final   Hemoglobin  Date Value Ref Range Status  07/24/2021 14.9 13.2 - 17.1 g/dL Final   HCT  Date Value Ref Range Status  07/24/2021 43.1 38.5 - 50.0 % Final   MCHC  Date Value Ref Range Status  07/24/2021 34.6 32.0 - 36.0 g/dL Final   [facility]  Date Value Ref Range Status  07/24/2021 32.7 27.0 - 33.0 pg Final   MCV  Date Value Ref Range Status  07/24/2021 94.7 80.0 - 100.0 fL Final   No results found for: "PLTCOUNTKUC", "LABPLAT", "POCPLA" RDW  Date Value Ref Range Status  07/24/2021 12.9 11.0 - 15.0 % Final            Requested Prescriptions  Pending Prescriptions Disp Refills   metFORMIN (GLUCOPHAGE) 500 MG tablet 120 tablet 2    Sig: TAKE 2 TABLETS(1000 MG) BY MOUTH TWICE DAILY WITH A MEAL     Endocrinology:  Diabetes - Biguanides  Failed - 01/23/2022  1:47 PM      Failed - HBA1C is between 0 and 7.9 and within 180 days    Hgb A1c MFr Bld  Date Value Ref Range Status  07/24/2021 6.3 (H) <5.7 % of total Hgb Final    Comment:    For someone without known diabetes, a hemoglobin  A1c value between 5.7% and 6.4% is consistent with prediabetes and should be confirmed with a  follow-up test. . For someone with known diabetes, a value <7% indicates that their diabetes is well controlled. A1c targets should be individualized based on duration of diabetes, age, comorbid conditions, and other considerations. . This assay result is consistent with an increased risk of diabetes. . Currently, no consensus exists regarding use of hemoglobin A1c for diagnosis of diabetes for children. .          Failed - B12 Level in normal range and within 720 days    No results found for: "VITAMINB12"       Failed - Valid encounter within last 6 months    Recent Outpatient Visits           6 months ago Diabetes mellitus with complication in adult patient [facility])   [place] Eric Frizzle, MD   10 months ago Acute bronchitis with COPD ([place])   [place] [name], Eric Mcgee, MD   10 months ago Upper respiratory tract infection, unspecified type   [place] Eric Bear, NP   1 year ago General medical exam   [place] Eric Frizzle, MD   2 years ago Diabetes mellitus with complication in adult patient [facility])   [place] [name], Eric Mcgee, MD              Passed - Cr in normal range and within 360 days    Creat  Date Value Ref Range Status  07/24/2021 1.14 0.70 - 1.35 mg/dL Final         Passed - eGFR in normal range and within 360 days    GFR, Est African American  Date Value Ref Range Status  03/20/2020 97 > OR = 60 mL/min/1.40m Final   GFR, Est Non African American  Date Value Ref Range Status  03/20/2020 84  > OR = 60 mL/min/1.732mFinal   eGFR  Date Value Ref Range Status  07/24/2021 73 > OR = 60 mL/min/1.82m Final    Comment:    The eGFR is based on the CKD-EPI 2021 equation. To calculate  the new eGFR from a previous Creatinine or Cystatin C result, go to https://www.kidney.org/professionals/ kdoqi/gfr%5Fcalculator          Passed - CBC within normal limits and completed in the last 12 months    WBC  Date Value Ref Range Status  07/24/2021 5.4 3.8 - 10.8 Thousand/uL Final   RBC  Date Value Ref Range Status  07/24/2021 4.55 4.20 - 5.80 Million/uL Final   Hemoglobin  Date Value Ref Range Status  07/24/2021 14.9 13.2 - 17.1 g/dL Final   HCT  Date Value Ref Range Status  07/24/2021 43.1 38.5 - 50.0 % Final   MCHC  Date Value Ref Range Status  07/24/2021 34.6 32.0 - 36.0 g/dL Final   [facility] Date Value Ref Range Status  07/24/2021 32.7 27.0 - 33.0 pg Final   MCV  Date Value Ref Range Status  07/24/2021 94.7 80.0 - 100.0 fL Final   No results found for: "PLTCOUNTKUC", "LABPLAT", "POCPLA" RDW  Date Value Ref Range Status  07/24/2021 12.9 11.0 - 15.0 % Final

## 2022-03-07 ENCOUNTER — Encounter: Payer: Self-pay | Admitting: Family Medicine

## 2022-03-20 ENCOUNTER — Other Ambulatory Visit: Payer: Self-pay | Admitting: Family Medicine

## 2022-03-29 ENCOUNTER — Encounter: Payer: Self-pay | Admitting: Family Medicine

## 2022-09-08 ENCOUNTER — Other Ambulatory Visit: Payer: Self-pay | Admitting: Family Medicine

## 2022-09-08 DIAGNOSIS — I10 Essential (primary) hypertension: Secondary | ICD-10-CM

## 2022-09-09 NOTE — Telephone Encounter (Signed)
Requested medication (s) are due for refill today: yes  Requested medication (s) are on the active medication list: yes  Last refill:  atorvastatin, hctz 10/10/21 #90/3 metformin 03/20/22 #360/1  Future visit scheduled: no  Notes to clinic:  called and spoke with pt, he lives in [place] now doesn't see new PCP until mid July, asking if can get 30 DS on meds to hold him over.     Requested Prescriptions  Pending Prescriptions Disp Refills   metFORMIN (GLUCOPHAGE) 500 MG tablet [Pharmacy Med Name: METFORMIN HCL 500 MG TABLET] 120 tablet 5    Sig: TAKE 2 TABLETS BY MOUTH TWICE DAILY WITH A MEAL     Endocrinology:  Diabetes - Biguanides Failed - 09/09/2022  3:57 PM      Failed - Cr in normal range and within 360 days    Creat  Date Value Ref Range Status  07/24/2021 1.14 0.70 - 1.35 mg/dL Final         Failed - HBA1C is between 0 and 7.9 and within 180 days    Hgb A1c MFr Bld  Date Value Ref Range Status  07/24/2021 6.3 (H) <5.7 % of total Hgb Final    Comment:    For someone without known diabetes, a hemoglobin  A1c value between 5.7% and 6.4% is consistent with prediabetes and should be confirmed with a  follow-up test. . For someone with known diabetes, a value <7% indicates that their diabetes is well controlled. A1c targets should be individualized based on duration of diabetes, age, comorbid conditions, and other considerations. . This assay result is consistent with an increased risk of diabetes. . Currently, no consensus exists regarding use of hemoglobin A1c for diagnosis of diabetes for children. .          Failed - eGFR in normal range and within 360 days    GFR, Est African American  Date Value Ref Range Status  03/20/2020 97 > OR = 60 mL/min/1.63m2 Final   GFR, Est Non African American  Date Value Ref Range Status  03/20/2020 84 > OR = 60 mL/min/1.54m2 Final   eGFR  Date Value Ref Range Status  07/24/2021 73 > OR = 60 mL/min/1.73m2 Final     Comment:    The eGFR is based on the CKD-EPI 2021 equation. To calculate  the new eGFR from a previous Creatinine or Cystatin C result, go to https://www.kidney.org/professionals/ kdoqi/gfr%5Fcalculator          Failed - B12 Level in normal range and within 720 days    No results found for: "VITAMINB12"       Failed - Valid encounter within last 6 months    Recent Outpatient Visits           1 year ago Diabetes mellitus with complication in adult patient [facility])   [facility] Donita Brooks, MD   1 year ago Acute bronchitis with COPD (HCC)   [facility] [name], Priscille Heidelberg, MD   1 year ago Upper respiratory tract infection, unspecified type   [facility] Valentino Nose, NP   2 years ago General medical exam   [facility] Donita Brooks, MD   2 years ago Diabetes mellitus with complication in adult patient [facility])   [facility] Donita Brooks, MD              Failed - CBC within normal limits and completed in  the last 12 months    WBC  Date Value Ref Range Status  07/24/2021 5.4 3.8 - 10.8 Thousand/uL Final   RBC  Date Value Ref Range Status  07/24/2021 4.55 4.20 - 5.80 Million/uL Final   Hemoglobin  Date Value Ref Range Status  07/24/2021 14.9 13.2 - 17.1 g/dL Final   HCT  Date Value Ref Range Status  07/24/2021 43.1 38.5 - 50.0 % Final   MCHC  Date Value Ref Range Status  07/24/2021 34.6 32.0 - 36.0 g/dL Final   [facility]  Date Value Ref Range Status  07/24/2021 32.7 27.0 - 33.0 pg Final   MCV  Date Value Ref Range Status  07/24/2021 94.7 80.0 - 100.0 fL Final   No results found for: "PLTCOUNTKUC", "LABPLAT", "POCPLA" RDW  Date Value Ref Range Status  07/24/2021 12.9 11.0 - 15.0 % Final          hydrochlorothiazide (HYDRODIURIL) 25 MG tablet 90 tablet 3    Sig: Take 1 tablet (25 mg total) by mouth daily.     Cardiovascular: Diuretics - Thiazide Failed -  09/09/2022  3:57 PM      Failed - Cr in normal range and within 180 days    Creat  Date Value Ref Range Status  07/24/2021 1.14 0.70 - 1.35 mg/dL Final         Failed - K in normal range and within 180 days    Potassium  Date Value Ref Range Status  07/24/2021 4.1 3.5 - 5.3 mmol/L Final         Failed - Na in normal range and within 180 days    Sodium  Date Value Ref Range Status  07/24/2021 140 135 - 146 mmol/L Final         Failed - Valid encounter within last 6 months    Recent Outpatient Visits           1 year ago Diabetes mellitus with complication in adult patient [facility])   [facility] Donita Brooks, MD   1 year ago Acute bronchitis with COPD (HCC)   [facility] [name], Priscille Heidelberg, MD   1 year ago Upper respiratory tract infection, unspecified type   [facility] Valentino Nose, NP   2 years ago General medical exam   [facility] Donita Brooks, MD   2 years ago Diabetes mellitus with complication in adult patient [facility])   [facility] [name], Priscille Heidelberg, MD              Passed - Last BP in normal range    BP Readings from Last 1 Encounters:  07/24/21 118/80          atorvastatin (LIPITOR) 40 MG tablet 90 tablet 3    Sig: TAKE 1 TABLET(40 MG) BY MOUTH DAILY     Cardiovascular:  Antilipid - Statins Failed - 09/09/2022  3:57 PM      Failed - Valid encounter within last 12 months    Recent Outpatient Visits           1 year ago Diabetes mellitus with complication in adult patient [facility])   [facility] [name], Priscille Heidelberg, MD   1 year ago Acute bronchitis with COPD (HCC)   [facility] [name], Priscille Heidelberg, MD   1 year ago Upper respiratory tract infection, unspecified type   [facility] Medicine Valentino Nose, NP  2 years ago General medical exam   [facility] Donita Brooks, MD   2 years ago Diabetes  mellitus with complication in adult patient [facility])   [facility] Donita Brooks, MD              Failed - Lipid Panel in normal range within the last 12 months    Cholesterol  Date Value Ref Range Status  07/24/2021 106 <200 mg/dL Final   LDL Cholesterol (Calc)  Date Value Ref Range Status  07/24/2021 47 mg/dL (calc) Final    Comment:    Reference range: <100 . Desirable range <100 mg/dL for primary prevention;   <70 mg/dL for patients with CHD or diabetic patients  with > or = 2 CHD risk factors. Marland Kitchen LDL-C is now calculated using the Monjaras-Hopkins  calculation, which is a validated novel method providing  better accuracy than the Friedewald equation in the  estimation of LDL-C.  Horald Pollen et al. Lenox Ahr. 1610;960(45): 2061-2068  (http://education.[employer].com/faq/FAQ164)    HDL  Date Value Ref Range Status  07/24/2021 37 (L) > OR = 40 mg/dL Final   Triglycerides  Date Value Ref Range Status  07/24/2021 139 <150 mg/dL Final         Passed - Patient is not pregnant

## 2022-09-11 NOTE — Telephone Encounter (Signed)
Patient's wife called to follow up on refill requests for   hydrochlorothiazide (HYDRODIURIL) 25 MG tablet   metFORMIN (GLUCOPHAGE) 500 MG tablet [[id number]]   As stated in previous  message, new patient appointment at new provider's office in [facility] isn't until mid July; needs courtesy refills to avoid a lapse in doses.  Please advise at [phone].

## 2022-09-11 NOTE — Telephone Encounter (Signed)
Correction: phone number [phone] is no longer in service.   Please advise patient at [phone]. See previous messages in thread for complete details.

## 2022-09-18 ENCOUNTER — Other Ambulatory Visit: Payer: Self-pay

## 2022-09-18 ENCOUNTER — Telehealth: Payer: Self-pay

## 2022-09-18 DIAGNOSIS — I10 Essential (primary) hypertension: Secondary | ICD-10-CM

## 2022-09-18 DIAGNOSIS — E118 Type 2 diabetes mellitus with unspecified complications: Secondary | ICD-10-CM

## 2022-09-18 DIAGNOSIS — E7800 Pure hypercholesterolemia, unspecified: Secondary | ICD-10-CM

## 2022-09-18 MED ORDER — METFORMIN HCL 500 MG PO TABS
ORAL_TABLET | ORAL | 2 refills | Status: DC
Start: 2022-09-18 — End: 2023-04-28

## 2022-09-18 MED ORDER — ATORVASTATIN CALCIUM 40 MG PO TABS
ORAL_TABLET | ORAL | 0 refills | Status: AC
Start: 2022-09-18 — End: ?

## 2022-09-18 MED ORDER — HYDROCHLOROTHIAZIDE 25 MG PO TABS: 25.0000 mg | ORAL_TABLET | Freq: Every day | ORAL | 0 refills | Status: AC

## 2022-09-18 NOTE — Telephone Encounter (Signed)
Erroneous encounter. Please disregard.

## 2022-09-26 ENCOUNTER — Encounter: Payer: Self-pay | Admitting: Family Medicine

## 2022-12-15 ENCOUNTER — Other Ambulatory Visit: Payer: Self-pay | Admitting: Family Medicine

## 2022-12-15 DIAGNOSIS — E118 Type 2 diabetes mellitus with unspecified complications: Secondary | ICD-10-CM

## 2022-12-17 NOTE — Telephone Encounter (Signed)
This pt now lives in [facility] [place].  Refused metformin 500 mg due to this.   Looks like he is establishing with a new PCP per note in June 2024.

## 2023-04-27 ENCOUNTER — Other Ambulatory Visit: Payer: Self-pay | Admitting: Family Medicine

## 2023-04-27 DIAGNOSIS — E118 Type 2 diabetes mellitus with unspecified complications: Secondary | ICD-10-CM
